# Patient Record
Sex: Male | Born: 1985 | Race: Black or African American | Hispanic: No | Marital: Single | Smoking: Never smoker
Health system: Southern US, Community
[De-identification: ages and names within clinical notes are randomized; demographics above are authoritative.]

## PROBLEM LIST (undated history)

## (undated) DIAGNOSIS — F79 Unspecified intellectual disabilities: Secondary | ICD-10-CM

## (undated) DIAGNOSIS — F32A Depression, unspecified: Secondary | ICD-10-CM

## (undated) DIAGNOSIS — I10 Essential (primary) hypertension: Secondary | ICD-10-CM

## (undated) DIAGNOSIS — F840 Autistic disorder: Secondary | ICD-10-CM

## (undated) DIAGNOSIS — F329 Major depressive disorder, single episode, unspecified: Secondary | ICD-10-CM

## (undated) DIAGNOSIS — F980 Enuresis not due to a substance or known physiological condition: Secondary | ICD-10-CM

## (undated) DIAGNOSIS — T7840XA Allergy, unspecified, initial encounter: Secondary | ICD-10-CM

## (undated) HISTORY — DX: Essential (primary) hypertension: I10

## (undated) HISTORY — DX: Allergy, unspecified, initial encounter: T78.40XA

## (undated) HISTORY — DX: Autistic disorder: F84.0

## (undated) HISTORY — DX: Major depressive disorder, single episode, unspecified: F32.9

## (undated) HISTORY — DX: Depression, unspecified: F32.A

## (undated) HISTORY — DX: Enuresis not due to a substance or known physiological condition: F98.0

## (undated) HISTORY — DX: Unspecified intellectual disabilities: F79

---

## 2004-05-30 ENCOUNTER — Encounter: Admission: RE | Admit: 2004-05-30 | Discharge: 2004-05-30 | Payer: Self-pay | Admitting: Family Medicine

## 2004-08-10 ENCOUNTER — Encounter: Admission: RE | Admit: 2004-08-10 | Discharge: 2004-08-10 | Payer: Self-pay | Admitting: Family Medicine

## 2004-09-26 ENCOUNTER — Inpatient Hospital Stay (HOSPITAL_COMMUNITY): Admission: AD | Admit: 2004-09-26 | Discharge: 2004-10-01 | Payer: Self-pay | Admitting: Family Medicine

## 2004-09-26 ENCOUNTER — Emergency Department (HOSPITAL_COMMUNITY): Admission: EM | Admit: 2004-09-26 | Discharge: 2004-09-26 | Payer: Self-pay | Admitting: Family Medicine

## 2004-09-26 ENCOUNTER — Ambulatory Visit: Payer: Self-pay | Admitting: Family Medicine

## 2004-10-12 ENCOUNTER — Ambulatory Visit: Payer: Self-pay | Admitting: Sports Medicine

## 2004-12-12 ENCOUNTER — Ambulatory Visit: Payer: Self-pay | Admitting: Family Medicine

## 2005-02-07 ENCOUNTER — Ambulatory Visit: Payer: Self-pay | Admitting: Sports Medicine

## 2005-03-07 ENCOUNTER — Ambulatory Visit: Payer: Self-pay | Admitting: Family Medicine

## 2005-07-30 ENCOUNTER — Ambulatory Visit: Payer: Self-pay | Admitting: Family Medicine

## 2006-02-11 ENCOUNTER — Ambulatory Visit: Payer: Self-pay | Admitting: Family Medicine

## 2006-05-31 IMAGING — CR DG FINGER RING 2+V*R*
1 series · 1 of 1 positions shown · non-contrast
Comparison: none

CLINICAL DATA: Swelling of the distal finger.  
 RIGHT RING FINGER (THREE VIEWS) 09/26/04 
 There is no bony abnormality.  There is diffuse soft tissue swelling. 
 IMPRESSION
 No bony abnormality.

[view not recorded]
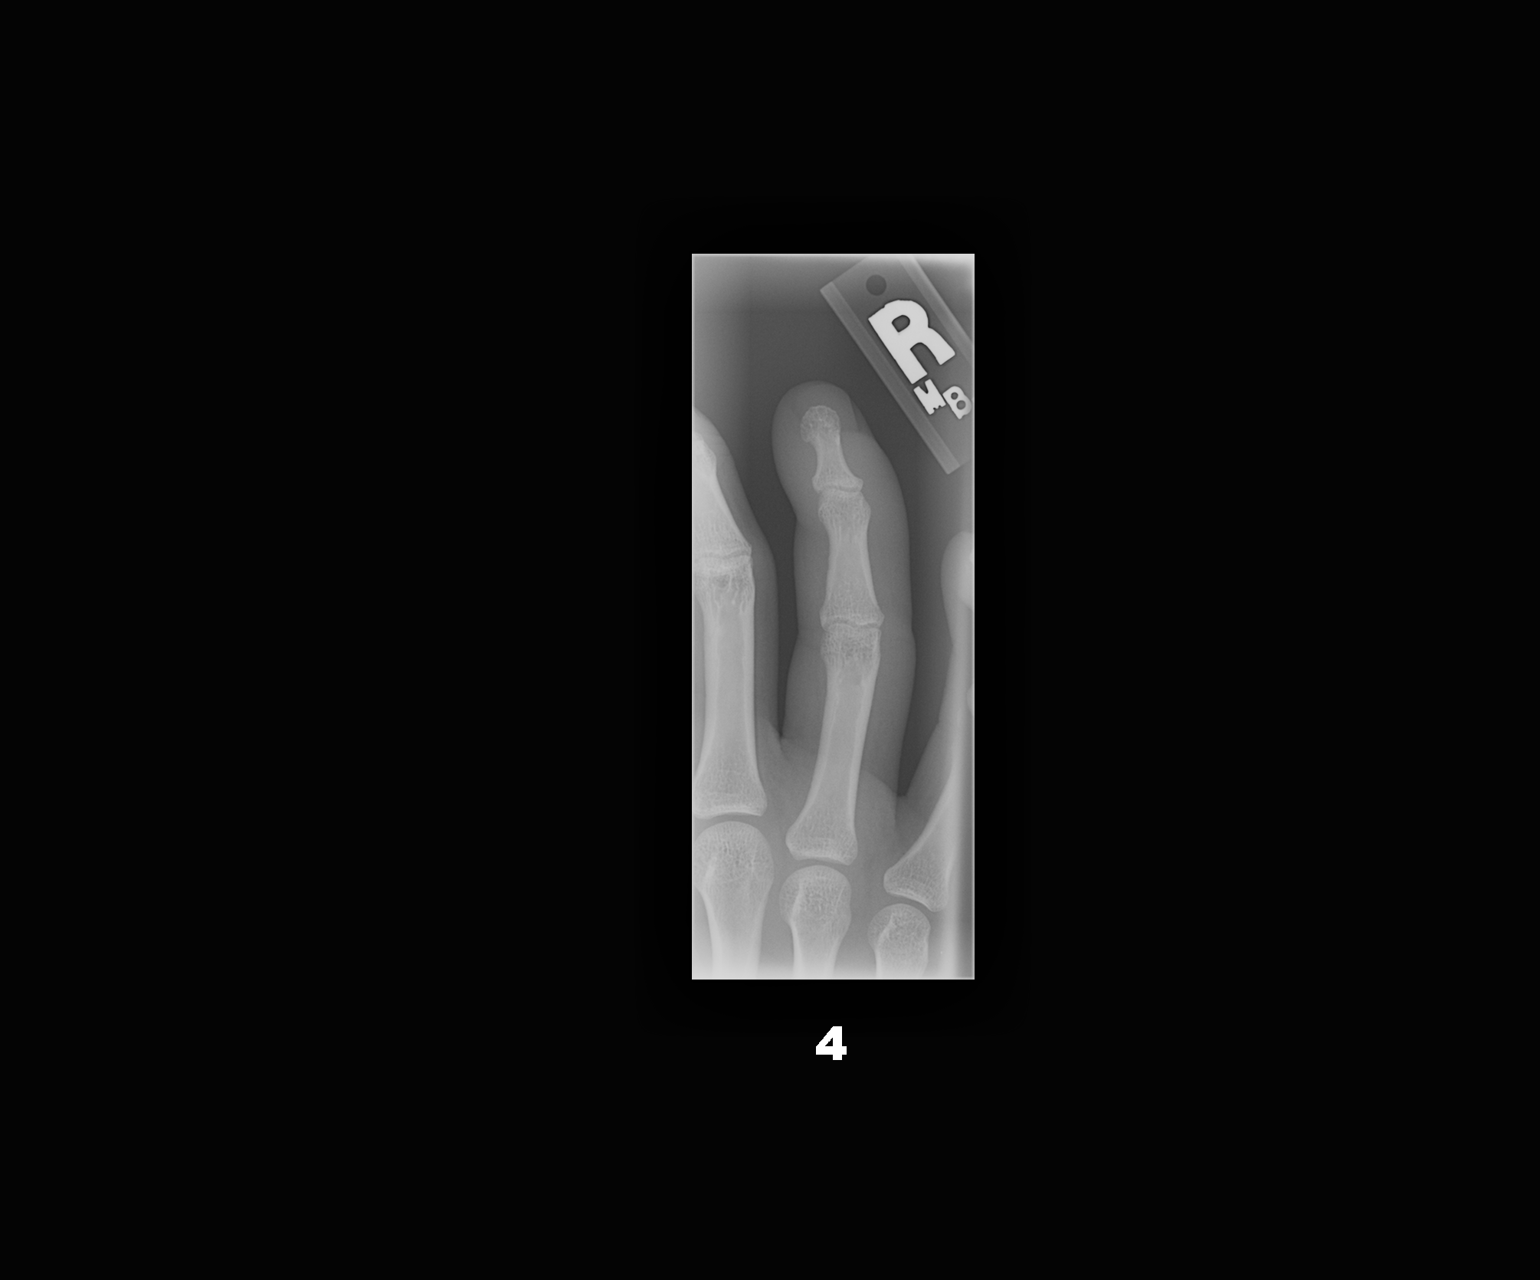

[1 of 1 positions shown; findings below may reference images not displayed]

## 2007-01-12 ENCOUNTER — Ambulatory Visit: Payer: Self-pay | Admitting: Family Medicine

## 2007-02-12 ENCOUNTER — Encounter: Payer: Self-pay | Admitting: Family Medicine

## 2007-02-12 ENCOUNTER — Ambulatory Visit: Payer: Self-pay | Admitting: Family Medicine

## 2007-02-12 LAB — CONVERTED CEMR LAB
BUN: 14 mg/dL (ref 6–23)
CO2: 29 meq/L (ref 19–32)
Calcium: 9.5 mg/dL (ref 8.4–10.5)
Chloride: 104 meq/L (ref 96–112)
Cholesterol: 136 mg/dL (ref 0–200)
Creatinine, Ser: 0.96 mg/dL (ref 0.40–1.50)
Glucose, Bld: 75 mg/dL (ref 70–99)
HDL: 43 mg/dL (ref >39)
LDL Cholesterol: 84 mg/dL (ref 0–99)
Potassium: 4.4 meq/L (ref 3.5–5.3)
Sodium: 141 meq/L (ref 135–145)
Total CHOL/HDL Ratio: 3.2
Triglycerides: 45 mg/dL (ref <150)
VLDL: 9 mg/dL (ref 0–40)

## 2007-02-19 DIAGNOSIS — I10 Essential (primary) hypertension: Secondary | ICD-10-CM

## 2007-02-19 DIAGNOSIS — F339 Major depressive disorder, recurrent, unspecified: Secondary | ICD-10-CM

## 2007-02-19 DIAGNOSIS — F70 Mild intellectual disabilities: Secondary | ICD-10-CM

## 2007-12-15 ENCOUNTER — Ambulatory Visit (HOSPITAL_COMMUNITY): Admission: RE | Admit: 2007-12-15 | Discharge: 2007-12-15 | Payer: Self-pay | Admitting: Dentistry

## 2007-12-15 ENCOUNTER — Telehealth (INDEPENDENT_AMBULATORY_CARE_PROVIDER_SITE_OTHER): Payer: Self-pay | Admitting: *Deleted

## 2008-01-28 ENCOUNTER — Telehealth: Payer: Self-pay | Admitting: *Deleted

## 2008-04-05 ENCOUNTER — Ambulatory Visit: Payer: Self-pay | Admitting: Family Medicine

## 2008-04-05 ENCOUNTER — Encounter: Payer: Self-pay | Admitting: Family Medicine

## 2008-04-05 DIAGNOSIS — F840 Autistic disorder: Secondary | ICD-10-CM | POA: Insufficient documentation

## 2008-04-05 LAB — CONVERTED CEMR LAB
ALT: 10 units/L (ref 0–53)
AST: 23 units/L (ref 0–37)
Alkaline Phosphatase: 65 units/L (ref 39–117)
Creatinine, Ser: 0.95 mg/dL (ref 0.40–1.50)
HDL: 56 mg/dL (ref >39)
Total Bilirubin: 0.8 mg/dL (ref 0.3–1.2)
Total CHOL/HDL Ratio: 2.5
VLDL: 8 mg/dL (ref 0–40)

## 2008-09-28 ENCOUNTER — Telehealth: Payer: Self-pay | Admitting: Family Medicine

## 2008-10-03 ENCOUNTER — Encounter: Payer: Self-pay | Admitting: Family Medicine

## 2008-10-24 ENCOUNTER — Telehealth (INDEPENDENT_AMBULATORY_CARE_PROVIDER_SITE_OTHER): Payer: Self-pay | Admitting: *Deleted

## 2009-01-18 ENCOUNTER — Ambulatory Visit: Payer: Self-pay | Admitting: Family Medicine

## 2009-01-18 ENCOUNTER — Telehealth: Payer: Self-pay | Admitting: *Deleted

## 2009-03-09 ENCOUNTER — Ambulatory Visit: Payer: Self-pay | Admitting: Family Medicine

## 2009-03-09 ENCOUNTER — Encounter: Payer: Self-pay | Admitting: Family Medicine

## 2009-03-09 LAB — CONVERTED CEMR LAB
Bilirubin Urine: NEGATIVE
Glucose, Urine, Semiquant: NEGATIVE
WBC Urine, dipstick: NEGATIVE
pH: 6

## 2009-04-17 ENCOUNTER — Ambulatory Visit: Payer: Self-pay | Admitting: Family Medicine

## 2009-05-04 ENCOUNTER — Ambulatory Visit: Payer: Self-pay | Admitting: Family Medicine

## 2009-05-04 ENCOUNTER — Encounter: Payer: Self-pay | Admitting: Family Medicine

## 2009-05-04 LAB — CONVERTED CEMR LAB
Alkaline Phosphatase: 65 units/L (ref 39–117)
Cholesterol: 150 mg/dL (ref 0–200)
Creatinine, Ser: 0.97 mg/dL (ref 0.40–1.50)
Glucose, Bld: 86 mg/dL (ref 70–99)
LDL Cholesterol: 83 mg/dL (ref 0–99)
Sodium: 141 meq/L (ref 135–145)
Total Bilirubin: 0.4 mg/dL (ref 0.3–1.2)
Total CHOL/HDL Ratio: 2.7
Total Protein: 7.2 g/dL (ref 6.0–8.3)
Triglycerides: 60 mg/dL (ref <150)
VLDL: 12 mg/dL (ref 0–40)

## 2009-08-09 ENCOUNTER — Ambulatory Visit: Payer: Self-pay | Admitting: Family Medicine

## 2009-08-09 ENCOUNTER — Encounter (INDEPENDENT_AMBULATORY_CARE_PROVIDER_SITE_OTHER): Payer: Self-pay | Admitting: *Deleted

## 2009-12-12 ENCOUNTER — Encounter: Payer: Self-pay | Admitting: Family Medicine

## 2009-12-12 ENCOUNTER — Ambulatory Visit: Payer: Self-pay | Admitting: Family Medicine

## 2010-03-08 ENCOUNTER — Ambulatory Visit: Payer: Self-pay | Admitting: Family Medicine

## 2010-03-08 DIAGNOSIS — F980 Enuresis not due to a substance or known physiological condition: Secondary | ICD-10-CM | POA: Insufficient documentation

## 2010-04-18 ENCOUNTER — Ambulatory Visit: Payer: Self-pay | Admitting: Family Medicine

## 2010-04-18 ENCOUNTER — Encounter: Payer: Self-pay | Admitting: Family Medicine

## 2010-04-19 ENCOUNTER — Encounter: Payer: Self-pay | Admitting: Family Medicine

## 2010-04-19 LAB — CONVERTED CEMR LAB
ALT: 11 units/L (ref 0–53)
AST: 21 units/L (ref 0–37)
Alkaline Phosphatase: 57 units/L (ref 39–117)
BUN: 11 mg/dL (ref 6–23)
Calcium: 9.7 mg/dL (ref 8.4–10.5)
Chloride: 104 meq/L (ref 96–112)
Creatinine, Ser: 1 mg/dL (ref 0.40–1.50)
HDL: 49 mg/dL (ref >39)
LDL Cholesterol: 82 mg/dL (ref 0–99)
Total Bilirubin: 0.5 mg/dL (ref 0.3–1.2)
Total CHOL/HDL Ratio: 3.1
VLDL: 19 mg/dL (ref 0–40)

## 2010-11-30 ENCOUNTER — Telehealth: Payer: Self-pay | Admitting: Family Medicine

## 2011-01-22 NOTE — Assessment & Plan Note (Signed)
Summary: having problem with incontinence,tcb   Vital Signs:  Patient profile:   25 year old male Weight:      162.1 pounds Pulse rate:   80 / minute BP sitting:   102 / 72  (right arm)  Vitals Entered By: Renato Battles slade,cma CC: request incontinence meds. Is Patient Diabetic? No Pain Assessment Patient in pain? no        Primary Care Provider:  Ardeen Garland  MD  CC:  request incontinence meds..  History of Present Illness: Comes in with his mother and worker from his adult day program.  He is nonverbal/autisitc.  WIsh to discuss incontinence.  Has been going on over a year.  1-2 times a week he wakes up and his bed is soaking wet with urine.  Other nights he is up multiple times a night to urinate.  MOm has been stopping giving him anything to drink after 4:30 in the afternoon, which helps, but he is so thirsty in the morning.  He goes to bed at 10:30.  No change in incontinence lately, but just heard from the daycare worker that there are meds that can help and then potentially she wouldn't have to limit his fluids so early in the day.  No dysuria, hematuria, polydipsia, fever, abdominal pain.  He is on HCTZ for HTN and she does give it to him at night.   Habits & Providers  Alcohol-Tobacco-Diet     Tobacco Status: never  Allergies: No Known Drug Allergies  Physical Exam  General:  Well-developed,well-nourished,in no acute distress; alert,appropriate and cooperative throughout examination vital reviewed   Impression & Recommendations:  Problem # 1:  ENURESIS (ICD-307.6) Assessment New  Requested DDAVP as this is what was recommended by her friend.  Given HTN and young age, think oxybutynin is best first choice. Will try IR formulat at night only first.  Also advised to give HCTZ in the AM.  Limit fluids within 2 hours of bedtime.  Return in 1 month to assess improvement.   Orders: [facility]- Est Level  3 (79892)  Complete Medication List: 1)  Hydrochlorothiazide 12.5 Mg  Caps (Hydrochlorothiazide) .Marland Kitchen.. 1 tab by mouth daily for blood pressure 2)  Risperdal 0.5 Mg Tabs (Risperidone) .... Per [facility] 3)  Clonidine Hcl 0.1 Mg Tabs (Clonidine hcl) .... Per [facility] 4)  Prozac 20 Mg Caps (Fluoxetine hcl) .... Per [facility] 5)  Miralax Powd (Polyethylene glycol 3350) .Marland KitchenMarland Kitchen. 17 gm in 8 oz fluid by mouth qdaily as needed constipation 6)  Patanol 0.1 % Soln (Olopatadine hcl) .Marland Kitchen.. 1 drop into both eyes twice daily. qs 7 days 7)  Doxycycline Monohydrate 100 Mg Caps (Doxycycline monohydrate) .... Take one pill daily for 14 days 8)  Oxybutynin Chloride 5 Mg Tabs (Oxybutynin chloride) .Marland Kitchen.. 1 tab by mouth qhs for nighttime urination  Patient Instructions: 1)  It was great to see you all today. 2)  WIth his hypertension, I would rather he try oxybutynin first.  Take one tablet at night to see how that helps.  Give it a few weeks.  It could give him dry mouth or make him dizzy.  If the dry mouth really bothers him or the dizziness persists after a week, please let me know. 3)  I would like to see him back in 1 month to see how he is doing.  If he is not better, we can talk about trying the ddavp.  4)  Also, give him his hydrochlorothiazide (HCTZ) in the morning  instead of in the evening. 5)  I would still limit fluids within 2 hours of bedtime.  If he goes to bed at 10:30, then I would stop giving him fluids at 8 or 8:30. Prescriptions: OXYBUTYNIN CHLORIDE 5 MG TABS (OXYBUTYNIN CHLORIDE) 1 tab by mouth qHS for nighttime urination  #30 x 2   Entered and Authorized by:   Ardeen Garland  MD   Signed by:   Ardeen Garland  MD on 03/08/2010   Method used:   Print then Give to Patient   RxID:   [id number]

## 2011-01-22 NOTE — Progress Notes (Signed)
Summary: UPDATE PROBLEM LIST   

## 2011-01-22 NOTE — Letter (Signed)
Summary: Generic Letter  [facility]  [address]   [place], [place] [address]   Phone: [phone]  Fax: [phone]    04/19/2010  Jossiah DAVE [address] [place], [place]  [address]  Dear Mr. Gangl,  I am happy to inform you that your labwork was normal.  We have faxed it to the [facility] as requested.  If you have any questions, please call our office.          Sincerely,   Ardeen Garland  MD  Appended Document: Generic Letter mailed.

## 2011-01-22 NOTE — Assessment & Plan Note (Signed)
Summary: CPE/KH   Vital Signs:  Patient profile:   25 year old male Height:      65 inches Weight:      167 pounds BMI:     27.89 Temp:     98.1 degrees F oral Pulse rate:   69 / minute BP sitting:   129 / 82  (left arm) Cuff size:   regular  Vitals Entered By: Tessie Fass CMA (April 18, 2010 9:35 AM) CC: complete physical Is Patient Diabetic? No Pain Assessment Patient in pain? no        Primary Care Provider:  Ardeen Garland  MD  CC:  complete physical.  History of Present Illness: [facility] is here with his mother and Luetta Nutting, a worker from his daycare program, for his yearly physical exam.  He is Programmer, multimedia, nonverbal, and attends an adult daycare program.  He does have depression and is followed at the [facility].  They prescribe his psychiatric medications.  They request a fasting lipid panel and CMET since he is on Risperdal.  His mother has no coplaints today.  Bartosz has been doing very well.  His BP is excellent today.    Habits & Providers  Alcohol-Tobacco-Diet     Tobacco Status: never  Current Medications (verified): 1)  Hydrochlorothiazide 12.5 Mg Caps (Hydrochlorothiazide) .Marland Kitchen.. 1 Tab By Mouth Daily For Blood Pressure 2)  Risperdal 0.5 Mg  Tabs (Risperidone) .... Per [facility] 3)  Clonidine Hcl 0.1 Mg  Tabs (Clonidine Hcl) .... Per [facility] 4)  Prozac 20 Mg  Caps (Fluoxetine Hcl) .... Per [facility] 5)  Miralax  Powd (Polyethylene Glycol 3350) .Marland KitchenMarland KitchenMarland Kitchen 17 Gm in 8 Oz Fluid By Mouth Qdaily As Needed Constipation 6)  Patanol 0.1 % Soln (Olopatadine Hcl) .Marland Kitchen.. 1 Drop Into Both Eyes Twice Daily. Qs 7 Days 7)  Doxycycline Monohydrate 100 Mg Caps (Doxycycline Monohydrate) .... Take One Pill Daily For 14 Days 8)  Oxybutynin Chloride 5 Mg Tabs (Oxybutynin Chloride) .Marland Kitchen.. 1 Tab By Mouth Qhs For Nighttime Urination  Allergies: No Known Drug Allergies  Past History:  Past Medical History: Last updated: 04/05/2008 autism- non verbal but  understands hospitalized for finger abscess 10-05 occasional nocturnal enuresis  Past Surgical History: Last updated: 02/19/2007 Incision and drainage finger felon R hand - 10/12/2004  Family History: Last updated: 04/05/2008 MGM- breast CA Mom- hypertension  Social History: Last updated: 01/18/2009 Lives w/ his mom and aunt.  Dad not involved.  Moved from [place] 2005.  Likes to play soccer.  Non-smoker, no drugs.  No girlfriends.  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  Ph# is [phone].  [place] number is [phone]  Physical Exam  General:  alert, well-developed, well-nourished, and well-hydrated.   vitals reviewed Head:  normocephalic, atraumatic, no abnormalities observed, and no abnormalities palpated.   Eyes:  pupils equal, pupils round, corneas and lenses clear, and no injection.   Ears:  bilateral TM's partially occluded by cerumen, visualized TMs pearly grey with good LR Nose:  External nasal examination shows no deformity or inflammation. Nasal mucosa are pink and moist without lesions or exudates. Mouth:  Oral mucosa and oropharynx without lesions or exudates.  Teeth in good repair. Lungs:  Normal respiratory effort, chest expands symmetrically. Lungs are clear to auscultation, no crackles or wheezes. Heart:  Normal rate and regular rhythm. S1 and S2 normal without gallop, murmur, click, rub or other extra sounds. Abdomen:  Bowel sounds positive,abdomen soft and non-tender without masses, organomegaly or hernias  noted. Msk:  normal ROM, no joint tenderness, no joint swelling, no joint warmth, no redness over joints, and no joint deformities.   Extremities:  No clubbing, cyanosis, edema, or deformity noted with normal full range of motion of all joints.   Neurologic:  strength normal in all extremities.   Skin:  Intact without suspicious lesions or rashes Cervical Nodes:  No lymphadenopathy noted   Impression & Recommendations:  Problem  # 1:  WELL ADULT EXAM (ICD-V70.0) Assessment Unchanged  Normal exam.  Doing well.  Weight has been increasing.  Discussed he is now in the overweight category.  Ideal weight would be about 10 pounds less.  Discussed watchign the food he is given - try to [facility] balanced and healthy.  Less fatty and sugary foods.  More vegetables. Does get a good amount of exercise at the daycare program.  CMET and FLP today to monitor risperdal.  Sennd to [facility], per last year's physical.   Orders: [facility] - Est  18-39 yrs ([address])  Problem # 2:  ENURESIS (ICD-307.6) Assessment: Improved Improved wtih oxybutynin.  Needs refilled.   Problem # 3:  HYPERTENSION, BENIGN SYSTEMIC (ICD-401.1) Assessment: Unchanged WEll controlled on HCTZ.  His updated medication list for this problem includes:    Hydrochlorothiazide 12.5 Mg Caps (Hydrochlorothiazide) .Marland Kitchen... 1 tab by mouth daily for blood pressure    Clonidine Hcl 0.1 Mg Tabs (Clonidine hcl) .Marland Kitchen... Per [facility]  Complete Medication List: 1)  Hydrochlorothiazide 12.5 Mg Caps (Hydrochlorothiazide) .Marland Kitchen.. 1 tab by mouth daily for blood pressure 2)  Risperdal 0.5 Mg Tabs (Risperidone) .... Per [facility] 3)  Clonidine Hcl 0.1 Mg Tabs (Clonidine hcl) .... Per [facility] 4)  Prozac 20 Mg Caps (Fluoxetine hcl) .... Per [facility] 5)  Miralax Powd (Polyethylene glycol 3350) .Marland KitchenMarland Kitchen. 17 gm in 8 oz fluid by mouth qdaily as needed constipation 6)  Patanol 0.1 % Soln (Olopatadine hcl) .Marland Kitchen.. 1 drop into both eyes twice daily. qs 7 days 7)  Oxybutynin Chloride 5 Mg Tabs (Oxybutynin chloride) .Marland Kitchen.. 1 tab by mouth qhs for nighttime urination  Other Orders: Comp Met-[facility] ([id number]) Lipid-[facility] ([id number]) Prescriptions: OXYBUTYNIN CHLORIDE 5 MG TABS (OXYBUTYNIN CHLORIDE) 1 tab by mouth qHS for nighttime urination  #30 x 11   Entered and Authorized by:   Ardeen Garland  MD   Signed by:   Ardeen Garland  MD on 04/18/2010   Method used:   Print then Give to  Patient   RxID:   [id number] HYDROCHLOROTHIAZIDE 12.5 MG CAPS (HYDROCHLOROTHIAZIDE) 1 tab by mouth daily for blood pressure  #30 Capsule x 11   Entered and Authorized by:   Ardeen Garland  MD   Signed by:   Ardeen Garland  MD on 04/18/2010   Method used:   Print then Give to Patient   RxID:   [id number]

## 2011-04-04 ENCOUNTER — Telehealth: Payer: Self-pay | Admitting: *Deleted

## 2011-04-04 DIAGNOSIS — I10 Essential (primary) hypertension: Secondary | ICD-10-CM

## 2011-04-04 MED ORDER — HYDROCHLOROTHIAZIDE 12.5 MG PO CAPS: 12.5000 mg | ORAL_CAPSULE | Freq: Every day | ORAL | Status: DC

## 2011-04-04 NOTE — Telephone Encounter (Signed)
Mother calls requesting refill on HCTZ . States pharmacy has been faxing and we have not received. Patient has appointment 05/01/2011. Will send in one month's supply to last until appointment.

## 2011-04-22 ENCOUNTER — Other Ambulatory Visit: Payer: Self-pay | Admitting: *Deleted

## 2011-04-22 DIAGNOSIS — F980 Enuresis not due to a substance or known physiological condition: Secondary | ICD-10-CM

## 2011-04-22 MED ORDER — OXYBUTYNIN CHLORIDE 5 MG PO TABS: 5.0000 mg | ORAL_TABLET | Freq: Every day | ORAL | Status: DC

## 2011-04-29 ENCOUNTER — Encounter: Payer: Self-pay | Admitting: Family Medicine

## 2011-05-01 ENCOUNTER — Encounter: Payer: Self-pay | Admitting: Family Medicine

## 2011-05-01 ENCOUNTER — Ambulatory Visit (INDEPENDENT_AMBULATORY_CARE_PROVIDER_SITE_OTHER): Payer: Medicaid Other | Admitting: Family Medicine

## 2011-05-01 VITALS — BP 116/71 | HR 73 | Temp 97.8°F | Ht 65.0 in | Wt 168.0 lb

## 2011-05-01 DIAGNOSIS — F980 Enuresis not due to a substance or known physiological condition: Secondary | ICD-10-CM

## 2011-05-01 DIAGNOSIS — I10 Essential (primary) hypertension: Secondary | ICD-10-CM

## 2011-05-01 DIAGNOSIS — K5900 Constipation, unspecified: Secondary | ICD-10-CM

## 2011-05-01 DIAGNOSIS — H1045 Other chronic allergic conjunctivitis: Secondary | ICD-10-CM

## 2011-05-01 DIAGNOSIS — H1010 Acute atopic conjunctivitis, unspecified eye: Secondary | ICD-10-CM | POA: Insufficient documentation

## 2011-05-01 DIAGNOSIS — R32 Unspecified urinary incontinence: Secondary | ICD-10-CM

## 2011-05-01 LAB — COMPREHENSIVE METABOLIC PANEL
ALT: 8 U/L (ref 0–53)
AST: 20 U/L (ref 0–37)
Albumin: 4.2 g/dL (ref 3.5–5.2)
Alkaline Phosphatase: 63 U/L (ref 39–117)
Glucose, Bld: 76 mg/dL (ref 70–99)
Potassium: 3.9 mEq/L (ref 3.5–5.3)
Sodium: 141 mEq/L (ref 135–145)
Total Bilirubin: 0.4 mg/dL (ref 0.3–1.2)
Total Protein: 7.2 g/dL (ref 6.0–8.3)

## 2011-05-01 MED ORDER — OXYBUTYNIN CHLORIDE 5 MG PO TABS: ORAL_TABLET | ORAL | Status: DC

## 2011-05-01 MED ORDER — OLOPATADINE HCL 0.1 % OP SOLN: 1.0000 [drp] | Freq: Two times a day (BID) | OPHTHALMIC | Status: DC

## 2011-05-01 MED ORDER — POLYETHYLENE GLYCOL 3350 17 G PO PACK: 17.0000 g | PACK | Freq: Every day | ORAL | Status: DC | PRN

## 2011-05-01 MED ORDER — HYDROCHLOROTHIAZIDE 12.5 MG PO CAPS: 12.5000 mg | ORAL_CAPSULE | Freq: Every day | ORAL | Status: DC

## 2011-05-01 NOTE — Patient Instructions (Signed)
It was nice to meet you today!  We are checking labs. 

## 2011-05-01 NOTE — Progress Notes (Deleted)
  Subjective:    Patient ID: Nicholas [facility]ing, male    DOB: 1986-01-09, 25 y.o.   MRN: [id number]  HPI    Review of Systems     Objective:   Physical Exam        Assessment & Plan:

## 2011-05-06 ENCOUNTER — Telehealth: Payer: Self-pay | Admitting: Family Medicine

## 2011-05-06 MED ORDER — HYDROCHLOROTHIAZIDE 12.5 MG PO CAPS: 12.5000 mg | ORAL_CAPSULE | Freq: Every day | ORAL | Status: DC

## 2011-05-06 NOTE — Telephone Encounter (Signed)
Mom calling re: bp med HCTZ, says it was called into pharmacy but MD did not add refills, is asking for MD to add 10 refills.

## 2011-05-06 NOTE — Telephone Encounter (Signed)
Will fwd. To PCP. .[facility]  

## 2011-05-06 NOTE — Telephone Encounter (Signed)
Done

## 2011-05-07 ENCOUNTER — Encounter: Payer: Self-pay | Admitting: Family Medicine

## 2011-05-07 NOTE — Assessment & Plan Note (Signed)
Increase Ditropan q hs.

## 2011-05-07 NOTE — Assessment & Plan Note (Signed)
Controlled. Refill medications. Check BMP.

## 2011-05-07 NOTE — Progress Notes (Signed)
  Subjective:    Patient ID: [facility] Harding, male    DOB: Aug 26, 1986, 25 y.o.   MRN: [id number]  HPI  1. Enuresis: Rx Ditropan. Went from wetting the bed 4-5 nights per week to 1-2 nights per week. No side effects noted.  2. HTN: Needs refills.  3. MR/Autism: Followed by Mental Health.  Review of Systems SEE HPI    Objective:   Physical Exam  Vitals reviewed. Constitutional: He appears well-developed and well-nourished. No distress.  HENT:  Left Ear: External ear normal.  Nose: Nose normal.  Mouth/Throat: Oropharynx is clear and moist.  Eyes: Conjunctivae and EOM are normal. Pupils are equal, round, and reactive to light.  Neck: Neck supple. No thyromegaly present.  Cardiovascular: Normal rate, regular rhythm, normal heart sounds and intact distal pulses.   Pulmonary/Chest: Effort normal and breath sounds normal.  Abdominal: Soft. Bowel sounds are normal. He exhibits no distension and no mass. There is no tenderness.  Musculoskeletal: Normal range of motion.  Neurological: He has normal reflexes.  Skin: Skin is warm and dry.      Assessment & Plan:

## 2011-05-10 NOTE — Op Note (Signed)
NAME:  N[facility], [facility] Harding NO.:  [id number]   MEDICAL RECORD NO.:  [id number]          PATIENT TYPE:  INP   LOCATION:  5021                         FACILITY:  [facility]   PHYSICIAN:  Dionne Ano. Gramig III, M.D.DATE OF BIRTH:  09-20-1986   DATE OF PROCEDURE:  09/27/2004  DATE OF DISCHARGE:                                 OPERATIVE REPORT   PREOPERATIVE DIAGNOSIS:  Right ring finger deep abscess with associated  lymphangitis and poor motor function secondary to the above.   POSTOPERATIVE DIAGNOSIS:  Right ring finger deep abscess with associated  lymphangitis and poor motor function secondary to the above.   PROCEDURE PERFORMED:  1.  Incision and drainage deep abscess right ring finger.  2.  Partial nail plate removal, right ring finger.   SURGEON:  Dionne Ano. Amanda Pea, M.D.   ASSISTANT:  Karie Chimera, P.A.-C.   COMPLICATIONS:  None.   ANESTHESIA:  General LMA.   TOURNIQUET TIME:  Less than 30 minutes.   ESTIMATED BLOOD LOSS:  Minimal.   DRAINS:  Large Iodoform drain was packed in the wound.   INDICATIONS FOR PROCEDURE:  The patient is a 25 year old autistic male who  has a long history of finger biting habitus.  This is an uncontrollable urge  and has fulminated in a large, deep abscess about the right ring finger.  We  have attempted to examine him and place an IV line, however, the autism  makes it quite difficult.  I have discussed with the parent, his mother, the  risks and benefits of surgery and, at the present time, feel that it is  absolutely essential to drain the large abscess to preserve the life of the  finger.  They understand this, the risks and benefits, and desire to  proceed.  All questions have been encouraged and answered preoperatively.   OPERATIVE FINDINGS:  The patient has a large, deep abscess about the right  ring finger.  He underwent I&D without difficulty as described above.  Partial nail plate removal was also accomplished.   PROCEDURE IN DETAIL:  The patient was taken to the operating suite.  He was  very carefully given a mask anesthetic until he was under a sedate state at  which time an LMA anesthetic was introduced and an IV line was placed.  Once  this was done, the patient then underwent prep and drape with Betadine scrub  and paint followed by elevation of the arm.  A tourniquet was then placed  but was not inflated.  Following this, I then performed an I&D of the deep  abscess.  A curvilinear dorsal approach to the finger was accomplished.  This encompassed the area distally and proximally around the DIP region.  A  large amount of nonviable skin was removed and a large abscess was drained.  Aerobic and anaerobic cultures were taken.  I performed a partial nail plate  removal.  The patient had deep tracking of the abscess proximally.  This was  not a standard paronychia but was rather a deep abscess.  There was no  obvious gross bony osteomyelitis.  The patient was copiously irrigated and  following this, was packed with Iodoform gauze and placed in a splint  apparatus.  He woke up successfully without problems and was transferred to  the recovery room.   We will plan for elevation, IV antibiotics, and initiation of whirlpools and  soaks tomorrow with wicking of the wound.  We will keep the IV in as long as  the patient allows while he is in house, however, the autism certainly makes  it difficult and we will gear our treatment based upon his comfort level and  infection improvement.  It has been a pleasure to participate in his care.  All questions have been encouraged and answered.       WMG/MEDQ  D:  09/27/2004  T:  09/27/2004  Job:  [id number]

## 2011-05-10 NOTE — H&P (Signed)
NAME:  Nicholas Harding, Nicholas Harding NO.:  [id number]   MEDICAL RECORD NO.:  [id number]          PATIENT TYPE:  INP   LOCATION:  5021                         FACILITY:  [facility]   PHYSICIAN:  Santiago Bumpers. Hensel, M.D.DATE OF BIRTH:  03-04-1986   DATE OF ADMISSION:  09/26/2004  DATE OF DISCHARGE:                                HISTORY & PHYSICAL   PRIMARY CARE PHYSICIAN:  Seymour Bars, D.O., [facility]  resident.   REFERRING PHYSICIAN:  Seymour Bars, D.O.   CONSULTATIONS:  1.  Dionne Ano. Amanda Pea, M.D.  2.  Janetta Hora. Gelene Mink, M.D.   CHIEF COMPLAINT:  Swollen right finger.   HISTORY OF PRESENT ILLNESS:  Patient is a 25 year old [facility]ing with a  one-day history of painful swelling of Nicholas right fourth digit.  Patient was  at school and [facility] nurse noticed that his finger was infected.  [facility] patient  [facility].  While at [facility] patient  had an x-ray obtained and several doctors attempted to drain Nicholas nail;  however, were unsuccessful due to Nicholas patient's being agitated and  combative.  [facility] patient is to be directly admitted to [facility] for conscious sedation and drainage by hand surgeon Dr. Amanda Pea.   [facility]icholas patient has a history of increased biting of his nails as noted by his  mother.  Patient is also nontalkative, but understands.   REVIEW OF SYSTEMS:  CONSTITUTIONAL: There are no fevers, nausea, vomiting,  changes in appetite, weight loss, or weight gain. CARDIOVASCULAR:  No chest  pain.  RESPIRATORY: No shortness of breath.  GASTROINTESTINAL: No diarrhea  or constipation.  SKIN:  Nicholas patient had hyperkeratotic areas on Nicholas  extensor sites of his bilateral wrists.  NEUROLOGIC:  Nontalkative.  PSYCHIATRIC:  No new psychiatric complaints.  No depression noted while on  medications.  MUSCULOSKELETAL:  Noncontributory.  ENDOCRINOLOGIC:  Noncontributory.  __________:  Noncontributory.  HEMATOLOGIC:  Noncontributory.   ALLERGIES:  There are no known drug allergies.   MEDICATIONS:  1.  Clonidine 0.1 mg 2 tablets q.h.s.  2.  Hydrochlorothiazide 12.5 mg p.o. daily.  3.  MiraLax 15 grams half a capful daily p.r.n.  4.  Prozac 10 mg p.o. q.a.m.  5.  Risperdal 0.5 mg p.o. q.h.s.   PAST MEDICAL HISTORY:  1.  Autism, nonverbal, but understands.  2.  Occasional nocturna enuresis.  3.  Mental retardation, mild.  4.  Depression, major, recurrent.  5.  Hypertension, benign, ___________.  6.  Constipation.   FAMILY HISTORY:  Mother has a history of hypertension.  Maternal grandmother  has a history of breast cancer.  Father's medical history is unable to be  obtained due to no contact.   SOCIAL HISTORY:  [facility] patient is a 25 year old.  He attends [facility].  He lives with his mother and his aunt.  Dad is not involved.  Patient moved from [place] in 2005.  [facility]olas patient likes to play soccer,  nonsmoker.  No drugs.  No girl friends.   ADVANCED DIRECTIVE:  This  patient is a [employer].   OTHER PHYSICIANS:  1.  Primary M.D. is Seymour Bars, D.O., [facility].  2.  Mental health - patient is seen at [facility], Dr. Mikey Harding, phone number [phone].   PHYSICAL EXAMINATION:  GENERAL APPEARANCE:  Nicholas patient is a well-developed,  well-nourished African-American male with autism, nontalkative.  VITAL SIGNS:  Vitals are yet to be obtained on H&P.  HEENT:  Extraocular muscles are intact.  Oropharynx is benign.  Moist mucous  membranes.  NECK:  Nicholas neck is supple with no masses, thyroid enlargement or tenderness.  CARDIOVASCULAR:  Regular rate and rhythm.  No murmurs, rubs or gallops.  RESPIRATORY:  Clear to auscultation bilaterally. Good symmetrical movement.  ABDOMEN:  Nicholas abdomen is soft, nontender, nondistended with positive  normoactive bowel sounds.  EXTREMITIES:  There is no noted cyanosis, edema, or clubbing of Nicholas lower  extremities,  or Nicholas left hand,  it is noted on Nicholas right fourth finger there  is redness and edema extending all Nicholas way to Nicholas metacarpal joint with  tenderness to palpation.  NEUROLOGIC:  Cranial nerves nonfocal.  SKIN:  There are hyperkeratotic areas noted on Nicholas bilateral extensor  wrists.   LABORATORY DATA:  Labs and tests:  Labs were unable to be obtained secondary  to Nicholas patient's combative nature and we were unable to obtain IV access.  Nicholas blood work and labs will be drawn while patient is under sedation in [facility]  OR.   ASSESSMENT AND PLAN:  This is a 25 year old Nicholas male with right fourth  digit abscess.   Problem #1  Abscess:  Nicholas patient is to go to hand surgery tomorrow for  sedation and drainage of Nicholas right fourth digit.  Patient will be placed on  Keflex 500 mg 1 tablet p.o. four times daily.  Nicholas patient is to have warm  water soaks if tolerated.   Problem #2  Gastrointestinal: Nicholas patient will be placed NPO prior to going  to [facility].   Problem #3  Autism:  Patient needs family caregiver present 24 hours a day.  Patient sees Dr. Mikey Harding.  We will be continuing his Risperdal and  Prozac while he is in [facility].   Problem 4  Hypertension:  We will continue his hydrochlorothiazide while in  [facility].   Problem #5  Access of Laboratory Work:  See noted in laboratories.       SJ/MEDQ  D:  09/26/2004  T:  09/27/2004  Job:  [id number]   cc:   Seymour Bars, D.O.  [facility].  Family Prac. Resident  [place], [place] [address]  Fax: [phone]   Eddie Candle, M.D.

## 2011-05-10 NOTE — Discharge Summary (Signed)
NAME:  Nicholas Harding, Nicholas Harding NO.:  [id number]   MEDICAL RECORD NO.:  [id number]          PATIENT TYPE:  INP   LOCATION:  5021                         FACILITY:  [facility]   PHYSICIAN:  Santiago Bumpers. Hensel, M.D.DATE OF BIRTH:  11/23/1986   DATE OF ADMISSION:  09/26/2004  DATE OF DISCHARGE:  10/01/2004                                 DISCHARGE SUMMARY   DIAGNOSES:  1.  Right __________ abscess.  2.  Autism.  3.  Hypertension.   CONSULTATIONS:  Dionne Ano. Amanda Pea, M.D.   PROCEDURE:  I&D under anesthesia done on September 26, 2004.   BRIEF HISTORY:  This is a 25 year old African American male with autism and  high blood pressure with __________ .  This abscess needed to be I&D under  anesthesia due to his condition and agitation.   MEDICATIONS IN THE HOSPITAL:  1.  Status post I&D Unasyn 3 grams IV q.6h. for five days.  2.  Prozac 10 mg p.o. daily.  3.  Risperidone __________ p.o. q.h.s.  4.  Morphine sulfate 4 mg IV q.4h. p.r.n.  5.  Percocet 1-2 tablets p.o. q.4-6h. p.r.n.  6.  Clonidine 0.2 mg p.o. q.h.s.   HOSPITAL COURSE:  Problem 1.  __________ required whirlpool therapy for  several days and wet to dry wound care.  The patient had good healing  process with the therapy and antibiotics.  Mom was interested on discharge  how to do the dressing changes, no Home Health recommended at that point.  The patient was switched from IV antibiotics to Doxycycline 100 mg by mouth  twice a day per Dr. Amanda Pea.   Problem 2.  Autism.  Patient was stable in his condition.  Continue to take  medications of Risperidone __________ 2.5 mg p.o. q.h.s. and Prozac 10 mg  p.o. daily for __________ .   Problem 3.  Hypertension.  The patient was stable during hospitalization,  treatment with Clonidine 0.2 mg p.o. q.h.s.   Problem 4.  Pain management.  For pain the patient received Percocet and  morphine sulfate p.r.n. while in the hospital and was given ibuprofen 600 mg  to take one tab  every 6 hours for pain if necessary.   Follow-up appointment with Dr. __________ at [facility] on  10/21 at __________.  Also follow-up appointment with Dr. Amanda Pea of  orthopedics __________ 11/28/04 at 1:30 p.m.       IM/MEDQ  D:  10/04/2004  T:  10/04/2004  Job:  [id number]

## 2011-05-30 ENCOUNTER — Ambulatory Visit (INDEPENDENT_AMBULATORY_CARE_PROVIDER_SITE_OTHER): Payer: Medicaid Other | Admitting: Family Medicine

## 2011-05-30 ENCOUNTER — Encounter: Payer: Self-pay | Admitting: Family Medicine

## 2011-05-30 VITALS — BP 117/79 | HR 98 | Temp 98.9°F | Wt 171.0 lb

## 2011-05-30 DIAGNOSIS — H1045 Other chronic allergic conjunctivitis: Secondary | ICD-10-CM

## 2011-05-30 DIAGNOSIS — H1010 Acute atopic conjunctivitis, unspecified eye: Secondary | ICD-10-CM

## 2011-05-30 MED ORDER — OLOPATADINE HCL 0.1 % OP SOLN: 1.0000 [drp] | Freq: Two times a day (BID) | OPHTHALMIC | Status: DC

## 2011-05-30 NOTE — Progress Notes (Signed)
  Subjective:    Patient ID: Nicholas Harding, male    DOB: [birth year], 25 y.o.   MRN: [id number]  HPI  1. Conjunctivitis: 3 days ago, mom gave Patanol drop in affected eye (right), got better, mom wants to make sure that everything is okay and that it is okay to continue giving drops. No fever/chills, N/V/D, rash, trauma, irritability, or rubbing eyes.  Review of Systems SEE HPI.    Objective:   Physical Exam  Vitals reviewed. Constitutional: He appears well-developed and well-nourished. No distress.  HENT:  Right Ear: External ear normal.  Left Ear: External ear normal.  Nose: Nose normal.  Mouth/Throat: Oropharynx is clear and moist.  Eyes: Conjunctivae and EOM are normal. Pupils are equal, round, and reactive to light. Right eye exhibits no discharge. Left eye exhibits no discharge.      Assessment & Plan:

## 2011-05-30 NOTE — Assessment & Plan Note (Signed)
Normal exam today. Discussed diagnosis with parent and appropriate use of eye drops. Refill today. Precautions for other types of conjunctivitis and possible corneal abrasion discussed.

## 2011-06-12 ENCOUNTER — Other Ambulatory Visit: Payer: Self-pay | Admitting: Family Medicine

## 2011-06-12 MED ORDER — LORAZEPAM 0.5 MG PO TABS: 0.5000 mg | ORAL_TABLET | Freq: Three times a day (TID) | ORAL | Status: AC | PRN

## 2011-06-12 MED ORDER — BENZTROPINE MESYLATE 1 MG PO TABS
0.5000 mg | ORAL_TABLET | Freq: Two times a day (BID) | ORAL | Status: DC
Start: 2011-06-12 — End: 2015-06-14

## 2011-09-09 ENCOUNTER — Other Ambulatory Visit: Payer: Self-pay | Admitting: Family Medicine

## 2011-09-09 DIAGNOSIS — F980 Enuresis not due to a substance or known physiological condition: Secondary | ICD-10-CM

## 2011-09-09 NOTE — Telephone Encounter (Signed)
Will fwd. To Dr.Corey for review. Nicholas Harding  

## 2011-09-09 NOTE — Telephone Encounter (Signed)
Ms. Sheard needs to know if she needs to bring Nicholas Harding in for a refill of Oxybutnynin 5mg .  He was last seen 3 months ago.  His next refill will be needed by 10/1 and they use [facility].  Please give her a call.

## 2011-09-11 MED ORDER — OXYBUTYNIN CHLORIDE 5 MG PO TABS: ORAL_TABLET | ORAL | Status: DC

## 2011-09-11 NOTE — Telephone Encounter (Signed)
Refilled x1.  Will need a visit in my clinic. Left a message

## 2011-09-20 ENCOUNTER — Encounter: Payer: Self-pay | Admitting: Family Medicine

## 2011-09-20 ENCOUNTER — Ambulatory Visit (INDEPENDENT_AMBULATORY_CARE_PROVIDER_SITE_OTHER): Payer: Medicaid Other | Admitting: Family Medicine

## 2011-09-20 VITALS — BP 112/67 | HR 86 | Wt 169.0 lb

## 2011-09-20 DIAGNOSIS — R32 Unspecified urinary incontinence: Secondary | ICD-10-CM

## 2011-09-20 DIAGNOSIS — Z23 Encounter for immunization: Secondary | ICD-10-CM

## 2011-09-20 DIAGNOSIS — F980 Enuresis not due to a substance or known physiological condition: Secondary | ICD-10-CM

## 2011-09-20 MED ORDER — OXYBUTYNIN CHLORIDE 5 MG PO TABS: ORAL_TABLET | ORAL | Status: DC

## 2011-09-20 NOTE — Patient Instructions (Signed)
Come back in 7 months for a wellness visit.  (Physical) in May

## 2011-09-20 NOTE — Progress Notes (Signed)
Nicholas Harding presents to clinic today for refill of oxybutynin.  He has mental retardation and autism.  He takes oxybutynin for enuresis.  He does well this medication without side effect.  He is in his normal state of health.  Additionally his mother would like Nicholas Harding flu shot.  PMH reviewed.  ROS as above otherwise neg Medications reviewed.  Exam:  BP 112/67  Pulse 86  Wt 169 lb (76.658 kg) Gen: Well NAD HEENT: EOMI,  MMM Lungs: CTABL Nl WOB Heart: RRR no MRG Abd: NABS, NT, ND Exts: Non edematous BL  LE, warm and well perfused.

## 2011-09-20 NOTE — Assessment & Plan Note (Signed)
Doing well oxybutynin refilled for 12 months. We'll followup at well adult visit in May

## 2011-09-27 LAB — CBC
Hemoglobin: 14
MCHC: 33.9
MCV: 90.3
RBC: 4.57
WBC: 4.8

## 2011-09-27 LAB — BASIC METABOLIC PANEL
BUN: 13
CO2: 23
Chloride: 107
Creatinine, Ser: 0.82
Glucose, Bld: 99

## 2012-01-29 ENCOUNTER — Ambulatory Visit (INDEPENDENT_AMBULATORY_CARE_PROVIDER_SITE_OTHER): Payer: Medicaid Other | Admitting: Family Medicine

## 2012-01-29 ENCOUNTER — Encounter: Payer: Self-pay | Admitting: Family Medicine

## 2012-01-29 VITALS — BP 157/76 | HR 66 | Temp 99.2°F | Ht 65.0 in | Wt 162.0 lb

## 2012-01-29 DIAGNOSIS — H1089 Other conjunctivitis: Secondary | ICD-10-CM | POA: Insufficient documentation

## 2012-01-29 DIAGNOSIS — R234 Changes in skin texture: Secondary | ICD-10-CM | POA: Insufficient documentation

## 2012-01-29 DIAGNOSIS — H109 Unspecified conjunctivitis: Secondary | ICD-10-CM

## 2012-01-29 MED ORDER — POLYMYXIN B-TRIMETHOPRIM 10000-0.1 UNIT/ML-% OP SOLN: OPHTHALMIC | Status: DC

## 2012-01-29 MED ORDER — UREA 40 % EX CREA: 1.0000 "application " | TOPICAL_CREAM | Freq: Every day | CUTANEOUS | Status: DC

## 2012-01-29 NOTE — Patient Instructions (Addendum)
Thank you for coming in today. Use the polytrim eye drops in both eyes every 4 hours while awake for 7 days.  Let know if he gets worse or not getting a few days.  Let know if he acts like he is in pain or having trouble breathing.  Apply the urea cream daily. If the skin feels normal/ smooth then stop for a while.

## 2012-01-29 NOTE — Progress Notes (Signed)
Nicholas Harding is a 26 y.o. male who presents to [facility] today for  [place] presents to clinic today with mild fever bilateral conjunctival injection and discharge for the past 3 days. He is eating and drinking normally and acting like his normal self. He does have a mild cough. He has been sent home from adult daycare.  Additionally his mom notes that he has thickened skin on the dorsum of his wrist due to repetitive motions. As previously is being treated with urea cream by orthopedics however she would like me to take over care.     PMH reviewed. 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 Current Outpatient Prescriptions  Medication Sig Dispense Refill  . benztropine (COGENTIN) 1 MG tablet Take 0.5 tablets (0.5 mg total) by mouth 2 (two) times daily.  30 tablet  0  . cloNIDine (CATAPRES) 0.1 MG tablet Per [facility]      . FLUoxetine (PROZAC) 20 MG capsule Per [facility]      . hydrochlorothiazide (,MICROZIDE/HYDRODIURIL,) 12.5 MG capsule Take 1 capsule (12.5 mg total) by mouth daily.  30 capsule  10  . LORazepam (ATIVAN) 0.5 MG tablet Take 1 tablet (0.5 mg total) by mouth every 8 (eight) hours as needed for anxiety.  30 tablet  0  . olopatadine (PATANOL) 0.1 % ophthalmic solution Place 1 drop into both eyes 2 (two) times daily. Use for 7 days.  5 mL  2  . oxybutynin (DITROPAN) 5 MG tablet Take 2 tabs po before bed.  60 tablet  12  . polyethylene glycol (MIRALAX / GLYCOLAX) packet Take 17 g by mouth daily as needed. Dissolve in 8 ounce of fluid-for constipation  14 each  3  . risperiDONE (RISPERDAL) 0.5 MG tablet Per [facility]      . trimethoprim-polymyxin b (POLYTRIM) ophthalmic solution 1 drop both eyes every 4 hours while awake for 7 days.  10 mL  0  . urea (CARMOL) 40 % CREA Apply 1 application topically daily.  1 each  6    Exam:  BP 157/76  Pulse 66  Temp(Src) 99.2 F (37.3 C) (Oral)  Ht 5\' 5"   (1.651 m)  Wt 162 lb (73.483 kg)  BMI 26.96 kg/m2 Gen: Well NAD HEENT:MMM bilateral conjunctival injection with mild discharge. Normal pupillary spots and extraocular motion. not in pain appearing Lungs: CTABL Nl WOB Heart: RRR no MRG Abd: NABS, NT, ND Exts: Non edematous BL  LE, warm and well perfused.  Skin: Hypopigmented and thickened skin on the dorsum of rest. Soft to touch

## 2012-01-29 NOTE — Assessment & Plan Note (Signed)
Likely infectious conjunctivitis. Plan to use Polytrim eyedrops both eyes every 4 hours while awake for 7 days. Discussed warning signs with mom who expresses understanding. Followup in a few months

## 2012-01-29 NOTE — Assessment & Plan Note (Addendum)
Thickened skin on the dorsum of wrists do to repetitive motions. Being treated with urea cream.  Advised mom to use the cream when his then becomes thickened and scaly but stop use when the skin feels soft.  She expresses understand

## 2012-02-06 ENCOUNTER — Telehealth: Payer: Self-pay | Admitting: *Deleted

## 2012-02-06 NOTE — Telephone Encounter (Signed)
PA required for Urea Cream.  Preferred meds are Retina A Micro , Differin , Clindamycin phosphate , benzaclin , or  Benzole peroxide .  Will place in MD box.

## 2012-02-07 NOTE — Telephone Encounter (Signed)
PA form faxed to Medicaid 

## 2012-02-10 NOTE — Telephone Encounter (Signed)
Received note back form medicaid today stating this cream does not required PA .  I called Medicaid and was told Urea cream is not covered by medicaid. at all.

## 2012-02-10 NOTE — Telephone Encounter (Signed)
Called mom back, Medicaid will not pay for urea cream. I advised to buy it over the counter.  She will discover the cost and get back to me if she has problems.

## 2012-05-06 ENCOUNTER — Encounter: Payer: Medicaid Other | Admitting: Family Medicine

## 2012-05-20 ENCOUNTER — Ambulatory Visit (INDEPENDENT_AMBULATORY_CARE_PROVIDER_SITE_OTHER): Payer: Medicaid Other | Admitting: Family Medicine

## 2012-05-20 ENCOUNTER — Encounter: Payer: Self-pay | Admitting: Family Medicine

## 2012-05-20 VITALS — BP 112/70 | HR 92 | Ht 65.0 in | Wt 167.0 lb

## 2012-05-20 DIAGNOSIS — K5900 Constipation, unspecified: Secondary | ICD-10-CM

## 2012-05-20 DIAGNOSIS — F980 Enuresis not due to a substance or known physiological condition: Secondary | ICD-10-CM

## 2012-05-20 DIAGNOSIS — Z0000 Encounter for general adult medical examination without abnormal findings: Secondary | ICD-10-CM

## 2012-05-20 DIAGNOSIS — R32 Unspecified urinary incontinence: Secondary | ICD-10-CM

## 2012-05-20 DIAGNOSIS — F70 Mild intellectual disabilities: Secondary | ICD-10-CM

## 2012-05-20 DIAGNOSIS — I10 Essential (primary) hypertension: Secondary | ICD-10-CM

## 2012-05-20 MED ORDER — POLYETHYLENE GLYCOL 3350 17 G PO PACK: 17.0000 g | PACK | Freq: Every day | ORAL | Status: DC | PRN

## 2012-05-20 MED ORDER — OXYBUTYNIN CHLORIDE 5 MG PO TABS: ORAL_TABLET | ORAL | Status: DC

## 2012-05-20 MED ORDER — HYDROCHLOROTHIAZIDE 12.5 MG PO CAPS: 12.5000 mg | ORAL_CAPSULE | Freq: Every day | ORAL | Status: DC

## 2012-05-20 NOTE — Patient Instructions (Signed)
Thank you for coming in today. Nicholas Harding looks good today.  We will do some labs today.  Follow up as needed or in 1 year.  I will fax the labs to [facility] [phone]

## 2012-05-20 NOTE — Assessment & Plan Note (Signed)
Doing well currently. Managed with adult day Center and the parents.  Psychiatry helping as well. We'll continue to follow

## 2012-05-20 NOTE — Assessment & Plan Note (Signed)
Refill oxybutynin. Doing well

## 2012-05-20 NOTE — Progress Notes (Signed)
Nicholas Harding is a 26 y.o. male who presents to [facility] today for a physical checkup.  In the interim he has done well. His medications remain stable. He has no new acute complaints today. He is eating and drinking well and continuing his normal level of activity. He attends an adult day Center.  The speech therapist there are looking to use an assistive device to augment his speech ability.   Additionally his psychiatrist would like some labs today.  Doing well no fevers chills nausea vomiting or diarrhea.   PMH: Reviewed significant for mental retardation History  Substance Use Topics  . Smoking status: Never Smoker   . Smokeless tobacco: Never Used  . Alcohol Use: No   ROS as above  Medications reviewed. Current Outpatient Prescriptions  Medication Sig Dispense Refill  . benztropine (COGENTIN) 1 MG tablet Take 0.5 tablets (0.5 mg total) by mouth 2 (two) times daily.  30 tablet  0  . cloNIDine (CATAPRES) 0.1 MG tablet Per [facility]      . FLUoxetine (PROZAC) 20 MG capsule Per [facility]      . hydrochlorothiazide (MICROZIDE) 12.5 MG capsule Take 1 capsule (12.5 mg total) by mouth daily.  30 capsule  10  . LORazepam (ATIVAN) 0.5 MG tablet Take 1 tablet (0.5 mg total) by mouth every 8 (eight) hours as needed for anxiety.  30 tablet  0  . olopatadine (PATANOL) 0.1 % ophthalmic solution Place 1 drop into both eyes 2 (two) times daily. Use for 7 days.  5 mL  2  . oxybutynin (DITROPAN) 5 MG tablet Take 2 tabs po before bed.  60 tablet  12  . polyethylene glycol (MIRALAX / GLYCOLAX) packet Take 17 g by mouth daily as needed. Dissolve in 8 ounce of fluid-for constipation  14 each  3  . risperiDONE (RISPERDAL) 0.5 MG tablet Per [facility]      . urea (CARMOL) 40 % CREA Apply 1 application topically daily.  1 each  6  . DISCONTD: hydrochlorothiazide (,MICROZIDE/HYDRODIURIL,) 12.5 MG capsule Take 1 capsule (12.5 mg total) by mouth daily.  30 capsule  10  . DISCONTD: oxybutynin (DITROPAN)  5 MG tablet Take 2 tabs po before bed.  60 tablet  12  . DISCONTD: polyethylene glycol (MIRALAX / GLYCOLAX) packet Take 17 g by mouth daily as needed. Dissolve in 8 ounce of fluid-for constipation  14 each  3    Exam:  BP 112/70  Pulse 92  Ht 5\' 5"  (1.651 m)  Wt 167 lb (75.751 kg)  BMI 27.79 kg/m2 Gen: Well NAD, active male in the exam room today. HEENT: EOMI,  MMM Lungs: CTABL Nl WOB Heart: RRR no MRG Abd: NABS, NT, ND Exts: Non edematous BL  LE, warm and well perfused.   No results found for this or any previous visit (from the past 72 hour(s)).

## 2012-05-20 NOTE — Assessment & Plan Note (Signed)
Blood pressure well controlled. Check basic metabolic panel today and refill hydrochlorothiazide

## 2012-05-21 LAB — COMPLETE METABOLIC PANEL WITH GFR
Albumin: 4.2 g/dL (ref 3.5–5.2)
Alkaline Phosphatase: 63 U/L (ref 39–117)
BUN: 10 mg/dL (ref 6–23)
CO2: 24 mEq/L (ref 19–32)
GFR, Est African American: >89 mL/min
GFR, Est Non African American: >89 mL/min
Glucose, Bld: 109 mg/dL — ABNORMAL HIGH (ref 70–99)
Potassium: 4.2 mEq/L (ref 3.5–5.3)
Total Protein: 7.1 g/dL (ref 6.0–8.3)

## 2012-05-21 LAB — CBC
HCT: 42.6 % (ref 39.0–52.0)
MCHC: 33.3 g/dL (ref 30.0–36.0)
Platelets: 227 10*3/uL (ref 150–400)
RDW: 13.9 % (ref 11.5–15.5)
WBC: 5.2 10*3/uL (ref 4.0–10.5)

## 2012-05-22 ENCOUNTER — Encounter: Payer: Self-pay | Admitting: Family Medicine

## 2012-06-17 ENCOUNTER — Encounter: Payer: Self-pay | Admitting: Family Medicine

## 2012-06-17 ENCOUNTER — Ambulatory Visit (INDEPENDENT_AMBULATORY_CARE_PROVIDER_SITE_OTHER): Payer: Medicaid Other | Admitting: Family Medicine

## 2012-06-17 VITALS — BP 101/67 | HR 76 | Temp 97.8°F | Wt 163.0 lb

## 2012-06-17 DIAGNOSIS — H1045 Other chronic allergic conjunctivitis: Secondary | ICD-10-CM

## 2012-06-17 DIAGNOSIS — H109 Unspecified conjunctivitis: Secondary | ICD-10-CM

## 2012-06-17 DIAGNOSIS — H1010 Acute atopic conjunctivitis, unspecified eye: Secondary | ICD-10-CM

## 2012-06-17 MED ORDER — POLYMYXIN B-TRIMETHOPRIM 10000-0.1 UNIT/ML-% OP SOLN: OPHTHALMIC | Status: DC

## 2012-06-17 NOTE — Patient Instructions (Signed)
Conjunctivitis Conjunctivitis is commonly called "pink eye." Conjunctivitis can be caused by bacterial or viral infection, allergies, or injuries. There is usually redness of the lining of the eye, itching, discomfort, and sometimes discharge. There may be deposits of matter along the eyelids. A viral infection usually causes a watery discharge, while a bacterial infection causes a yellowish, thick discharge. Pink eye is very contagious and spreads by direct contact. You may be given antibiotic eyedrops as part of your treatment. Before using your eye medicine, remove all drainage from the eye by washing gently with warm water and cotton balls. Continue to use the medication until you have awakened 2 mornings in a row without discharge from the eye. Do not rub your eye. This increases the irritation and helps spread infection. Use separate towels from other household members. Wash your hands with soap and water before and after touching your eyes. Use cold compresses to reduce pain and sunglasses to relieve irritation from light. Do not wear contact lenses or wear eye makeup until the infection is gone. SEEK MEDICAL CARE IF:   Your symptoms are not better after 3 days of treatment.   You have increased pain or trouble seeing.   The outer eyelids become very red or swollen.  Document Released: 01/16/2005 Document Revised: 11/28/2011 Document Reviewed: 12/09/2005 ExitCare Patient Information 2012 [employer]. 

## 2012-06-18 NOTE — Progress Notes (Signed)
  Subjective:    Patient ID: Nicholas Harding, male    DOB: June 23, 1986, 26 y.o.   MRN: [id number]  HPI Conjunctivitis x 1 week.  L eye.  Baseline hx/o mild MR and autism.  Pt is here with grandmother.  + L eye tearing and crusting.  No purulent drainage.  No fevers, chills, change in baseline mental status Vision is at baseline per grandmother.  Currently in weekly day care.    Review of Systems See HPI, otherwise ROS negative     Objective:   Physical Exam  Constitutional: He appears well-developed and well-nourished.  HENT:  Head: Normocephalic and atraumatic.  Right Ear: External ear normal.  Left Ear: External ear normal.  Eyes: Pupils are equal, round, and reactive to light.    Neck: Normal range of motion. Neck supple.  Cardiovascular: Normal rate and regular rhythm.   Pulmonary/Chest: Effort normal and breath sounds normal.  Abdominal: Soft. Bowel sounds are normal.          Assessment & Plan:

## 2012-06-18 NOTE — Assessment & Plan Note (Signed)
L eye conjunctivitis. Likely viral.  Will start on empiric coverage.  Discussed supportive care and infectious red flags.  Handout given.  Follow up as needed.

## 2012-08-03 ENCOUNTER — Telehealth: Payer: Self-pay | Admitting: Family Medicine

## 2012-08-03 NOTE — Telephone Encounter (Signed)
Faxed order for speech assessment for augmentative communication device and referral to speech therapist: [name].  Also called [name] with [facility] to let her know that order was being faxed.

## 2012-08-04 ENCOUNTER — Telehealth: Payer: Self-pay | Admitting: Family Medicine

## 2012-08-04 NOTE — Telephone Encounter (Signed)
Called and spoke with Maralyn Sago. I faxed the pt's info to [phone].   I also told her, if they need more they would need an OV with new PCP Dr.Losq. Lorenda Hatchet, Renato Battles

## 2012-08-04 NOTE — Telephone Encounter (Signed)
Home Health Agency is calling because they need more information on the patient in order to start care.  They need a face sheet and H&P.  They only received an order.

## 2012-09-01 ENCOUNTER — Ambulatory Visit: Payer: Medicaid Other | Admitting: Family Medicine

## 2012-09-15 ENCOUNTER — Ambulatory Visit: Payer: Medicaid Other | Admitting: Family Medicine

## 2012-09-21 ENCOUNTER — Other Ambulatory Visit: Payer: Self-pay | Admitting: Family Medicine

## 2012-10-14 ENCOUNTER — Encounter: Payer: Self-pay | Admitting: Family Medicine

## 2012-10-14 ENCOUNTER — Ambulatory Visit (INDEPENDENT_AMBULATORY_CARE_PROVIDER_SITE_OTHER): Payer: Medicaid Other | Admitting: Family Medicine

## 2012-10-14 VITALS — BP 101/69 | HR 74 | Ht 65.0 in | Wt 178.0 lb

## 2012-10-14 DIAGNOSIS — H1045 Other chronic allergic conjunctivitis: Secondary | ICD-10-CM

## 2012-10-14 DIAGNOSIS — F840 Autistic disorder: Secondary | ICD-10-CM

## 2012-10-14 DIAGNOSIS — I10 Essential (primary) hypertension: Secondary | ICD-10-CM

## 2012-10-14 DIAGNOSIS — H1010 Acute atopic conjunctivitis, unspecified eye: Secondary | ICD-10-CM

## 2012-10-14 DIAGNOSIS — Z23 Encounter for immunization: Secondary | ICD-10-CM

## 2012-10-14 DIAGNOSIS — R234 Changes in skin texture: Secondary | ICD-10-CM

## 2012-10-14 MED ORDER — UREA 40 % EX CREA: 1.0000 "application " | TOPICAL_CREAM | Freq: Every day | CUTANEOUS | Status: DC

## 2012-10-14 MED ORDER — OLOPATADINE HCL 0.1 % OP SOLN: 1.0000 [drp] | Freq: Two times a day (BID) | OPHTHALMIC | Status: DC

## 2012-10-15 NOTE — Assessment & Plan Note (Signed)
Request for referral to speech therapy at [facility] with Burgess Estelle (fax: [phone], tel: [phone]) for work with communication device: Aeronautical engineer. Will send in referral as this may improve his ability to communicate.

## 2012-10-15 NOTE — Assessment & Plan Note (Signed)
Refilled patanol.

## 2012-10-15 NOTE — Progress Notes (Signed)
Patient ID: Nicholas Harding    DOB: [birth year], 26 y.o.   MRN: [id number] --- Subjective:  Nicholas Harding is a 26 y.o.male with non-verbal autism who presents accompanied by his mother and his case worker for a referral for speech therapy. The speech therapist at school has worked with him with a communication device called [employer] Gwynn Burly, which has helped with his communication. The school speech therapist therefore recommended that he be referred for speech therapy to work on this.  - hypertension: well controled. Currently on HCTZ 12.5 and clonidine 0.1mg . No evidence of dizziness or lightheadedness, suggesting hypotension. No swelling or increased work of breathing.   ROS: see HPI Past Medical History: reviewed and updated medications and allergies. Social History: Tobacco: denies  Objective: Filed Vitals:   10/14/12 1533  BP: 101/69  Pulse: 74    Physical Examination:   General appearance - alert, non verbal, well groomed, cooperative with exam Chest - clear to auscultation, no wheezes, rales or rhonchi, symmetric air entry Heart - normal rate, regular rhythm, normal S1, S2, no murmurs, rubs, clicks or gallops Abdomen - soft, nontender, nondistended, no masses or organomegaly Extremities - peripheral pulses normal, no pedal edema

## 2012-10-15 NOTE — Assessment & Plan Note (Addendum)
Blood pressure consistent from previous visit in June. Currently 101/69. Mother and caregiver deny any signs or symptoms of hypotension. Will continue with clonidine and hctz for now but will have low threshold on decreasing coverage if he starts becoming symptomatic.

## 2012-10-21 ENCOUNTER — Telehealth: Payer: Self-pay | Admitting: Family Medicine

## 2012-10-21 NOTE — Telephone Encounter (Signed)
Faxed info to [phone]

## 2012-10-21 NOTE — Telephone Encounter (Signed)
Home Health needs demographic and History and Physical on the patient for PT to be able to go out.  They will also need his insurance info faxed to [phone].

## 2013-04-01 ENCOUNTER — Other Ambulatory Visit: Payer: Self-pay | Admitting: Family Medicine

## 2013-04-01 NOTE — Telephone Encounter (Signed)
Faxing order for "augmentative communication device" to [name] and Leretha Pol at [facility] who have been working with Manufacturing systems engineer with [employer].   Marena Chancy, PGY-2 Family Medicine Resident

## 2013-04-19 ENCOUNTER — Other Ambulatory Visit: Payer: Self-pay | Admitting: *Deleted

## 2013-04-19 DIAGNOSIS — I10 Essential (primary) hypertension: Secondary | ICD-10-CM

## 2013-04-19 MED ORDER — HYDROCHLOROTHIAZIDE 12.5 MG PO CAPS: 12.5000 mg | ORAL_CAPSULE | Freq: Every day | ORAL | Status: DC

## 2013-05-24 ENCOUNTER — Other Ambulatory Visit: Payer: Self-pay | Admitting: *Deleted

## 2013-05-24 DIAGNOSIS — K5900 Constipation, unspecified: Secondary | ICD-10-CM

## 2013-05-24 MED ORDER — POLYETHYLENE GLYCOL 3350 17 G PO PACK: 17.0000 g | PACK | Freq: Every day | ORAL | Status: DC | PRN

## 2013-06-02 ENCOUNTER — Encounter: Payer: Self-pay | Admitting: Family Medicine

## 2013-06-02 ENCOUNTER — Ambulatory Visit (INDEPENDENT_AMBULATORY_CARE_PROVIDER_SITE_OTHER): Payer: Medicaid Other | Admitting: Family Medicine

## 2013-06-02 VITALS — BP 125/79 | HR 73 | Ht 65.0 in | Wt 179.0 lb

## 2013-06-02 DIAGNOSIS — F840 Autistic disorder: Secondary | ICD-10-CM

## 2013-06-02 DIAGNOSIS — K5900 Constipation, unspecified: Secondary | ICD-10-CM

## 2013-06-02 DIAGNOSIS — R140 Abdominal distension (gaseous): Secondary | ICD-10-CM

## 2013-06-02 DIAGNOSIS — I10 Essential (primary) hypertension: Secondary | ICD-10-CM

## 2013-06-02 DIAGNOSIS — R141 Gas pain: Secondary | ICD-10-CM

## 2013-06-02 DIAGNOSIS — F980 Enuresis not due to a substance or known physiological condition: Secondary | ICD-10-CM

## 2013-06-02 DIAGNOSIS — R143 Flatulence: Secondary | ICD-10-CM

## 2013-06-02 LAB — CBC WITH DIFFERENTIAL/PLATELET
Basophils Absolute: 0 10*3/uL (ref 0.0–0.1)
Eosinophils Absolute: 0.3 10*3/uL (ref 0.0–0.7)
Eosinophils Relative: 5 % (ref 0–5)
HCT: 42.1 % (ref 39.0–52.0)
Lymphocytes Relative: 39 % (ref 12–46)
MCH: 29.8 pg (ref 26.0–34.0)
MCHC: 33.7 g/dL (ref 30.0–36.0)
MCV: 88.4 fL (ref 78.0–100.0)
Monocytes Absolute: 0.5 10*3/uL (ref 0.1–1.0)
Platelets: 254 10*3/uL (ref 150–400)
RDW: 12.8 % (ref 11.5–15.5)

## 2013-06-02 LAB — COMPLETE METABOLIC PANEL WITH GFR
ALT: 11 U/L (ref 0–53)
AST: 23 U/L (ref 0–37)
Albumin: 4.1 g/dL (ref 3.5–5.2)
Alkaline Phosphatase: 63 U/L (ref 39–117)
BUN: 14 mg/dL (ref 6–23)
Calcium: 9.2 mg/dL (ref 8.4–10.5)
Chloride: 103 mEq/L (ref 96–112)
Potassium: 3.9 mEq/L (ref 3.5–5.3)
Sodium: 138 mEq/L (ref 135–145)
Total Protein: 7.4 g/dL (ref 6.0–8.3)

## 2013-06-02 LAB — LIPID PANEL
HDL: 41 mg/dL (ref >39)
LDL Cholesterol: 90 mg/dL (ref 0–99)

## 2013-06-02 MED ORDER — HYDROCHLOROTHIAZIDE 12.5 MG PO CAPS: 12.5000 mg | ORAL_CAPSULE | Freq: Every day | ORAL | Status: DC

## 2013-06-02 MED ORDER — OXYBUTYNIN CHLORIDE 5 MG PO TABS: 10.0000 mg | ORAL_TABLET | Freq: Every day | ORAL | Status: DC

## 2013-06-02 MED ORDER — POLYETHYLENE GLYCOL 3350 17 G PO PACK: 17.0000 g | PACK | Freq: Every day | ORAL | Status: DC | PRN

## 2013-06-02 NOTE — Patient Instructions (Signed)
I will fax the bloodwork to [facility]. Follow up with me in 6 months-81months

## 2013-06-04 NOTE — Progress Notes (Signed)
Patient ID: Donaven Mentzer    DOB: [birth year], 27 y.o.   MRN: [id number] --- Subjective:  Tyresse is a 27 y.o.male with non verbal autistic disorder who presents with his mother for yearly physical.  She requests refills on hctz 12.5gmg, miralax and ditropan 5mg .  - constipation: controled with miralax daily. She does report some abdominal distention in the last 3 months. No obvious abdominal discomfort. There has been some weight gain.  - htn: on hctz 12.5mg  daily and clonidine 0.1mg . No obvious shortness of breath or chest pain.   ROS: see HPI Past Medical History: reviewed and updated medications and allergies. Social History: Tobacco: none  Objective: Filed Vitals:   06/02/13 1528  BP: 125/79  Pulse: 73    Physical Examination:   General appearance - alert, in no acute distress, calm, non verbal, cooperative with exam Neck - supple, no significant adenopathy Chest - clear to auscultation, no wheezes, rales or rhonchi, symmetric air entry Heart - normal rate, regular rhythm, normal S1, S2, no murmurs, rubs, clicks or gallops Abdomen - soft, nontender, distended, no obvious fluid wave, no hepatomegaly appreciated although exam is limited  Extremities - no lower extremity edema

## 2013-06-05 DIAGNOSIS — R140 Abdominal distension (gaseous): Secondary | ICD-10-CM | POA: Insufficient documentation

## 2013-06-05 NOTE — Assessment & Plan Note (Addendum)
Well controled on clonidine 0.1mg  and hctz 12.5mg . Continue current regimen. Obtain yearly BMP and lipid panel.  Will fax results to patient's nurse at: [facility]: Brock Bad: fax: [phone], tel: [phone]

## 2013-06-05 NOTE — Assessment & Plan Note (Signed)
Refill miralax

## 2013-06-05 NOTE — Assessment & Plan Note (Signed)
Could be from gaseous distention, but since exam is limited in non verbal patient, will obtain LFT's and abdominal ultrasound to rule out any hepatic component or ascites.

## 2013-06-05 NOTE — Assessment & Plan Note (Addendum)
Refilled oxybutinin 5mg  two tabs qhs, which helps with symptoms.

## 2013-06-08 ENCOUNTER — Encounter: Payer: Self-pay | Admitting: Family Medicine

## 2013-06-08 ENCOUNTER — Ambulatory Visit (HOSPITAL_COMMUNITY)
Admission: RE | Admit: 2013-06-08 | Discharge: 2013-06-08 | Disposition: A | Discharge status: Home / Self Care | Payer: Medicaid Other | Source: Ambulatory Visit | Attending: Family Medicine | Admitting: Family Medicine

## 2013-06-08 ENCOUNTER — Other Ambulatory Visit: Payer: Self-pay | Admitting: Family Medicine

## 2013-06-08 DIAGNOSIS — R142 Eructation: Secondary | ICD-10-CM | POA: Insufficient documentation

## 2013-06-08 DIAGNOSIS — R141 Gas pain: Secondary | ICD-10-CM | POA: Insufficient documentation

## 2013-06-08 DIAGNOSIS — R140 Abdominal distension (gaseous): Secondary | ICD-10-CM

## 2013-06-08 DIAGNOSIS — R143 Flatulence: Secondary | ICD-10-CM | POA: Insufficient documentation

## 2013-06-11 ENCOUNTER — Encounter: Payer: Self-pay | Admitting: Family Medicine

## 2013-06-11 DIAGNOSIS — F840 Autistic disorder: Secondary | ICD-10-CM

## 2013-07-21 ENCOUNTER — Telehealth: Payer: Self-pay | Admitting: Family Medicine

## 2013-07-21 NOTE — Telephone Encounter (Signed)
Mom is calling asking for the patient blood work results to be faxed to [facility] [phone], attention: Dr. Weldon Picking. Greninger.  She would also like a copy mailed to her.

## 2013-07-21 NOTE — Telephone Encounter (Signed)
Lab results faxed and mailed.Busick, Rodena Medin

## 2013-09-29 ENCOUNTER — Telehealth: Payer: Self-pay | Admitting: Family Medicine

## 2013-09-29 NOTE — Telephone Encounter (Signed)
Patient needs an up-to-date referral for speech evaluation sent to [facility], Care Coordinator at [facility]. Fax- [phone]

## 2013-09-29 NOTE — Telephone Encounter (Signed)
Will fwd to MD for review and approval.  If approved, will call number listed below.   Jahsir Rama, Darlyne Russian, CMA

## 2013-09-29 NOTE — Telephone Encounter (Signed)
Wrote prescription for referral for speech therapy and sent it to [facility], Care Coordinator at [facility]. Fax- [phone]  Marena Chancy, PGY-3 Family Medicine Resident

## 2013-10-01 ENCOUNTER — Ambulatory Visit: Payer: Medicaid Other

## 2013-10-04 ENCOUNTER — Encounter: Payer: Self-pay | Admitting: *Deleted

## 2013-10-04 ENCOUNTER — Ambulatory Visit (INDEPENDENT_AMBULATORY_CARE_PROVIDER_SITE_OTHER): Payer: Medicaid Other | Admitting: *Deleted

## 2013-10-04 DIAGNOSIS — Z23 Encounter for immunization: Secondary | ICD-10-CM

## 2013-10-05 DIAGNOSIS — Z23 Encounter for immunization: Secondary | ICD-10-CM

## 2013-12-31 ENCOUNTER — Telehealth: Payer: Self-pay | Admitting: *Deleted

## 2013-12-31 NOTE — Telephone Encounter (Signed)
Patient has an appt at [facility] for speech therapy evaluation.  Due to patient having Medicaid, office requesting NPI #  to authorize appt.  NPI # given for 1 visit.  Office will call back if additional visits are needed.    Gaylene Brooksichardson, Jeannette Ann, RN

## 2014-03-22 ENCOUNTER — Telehealth: Payer: Self-pay | Admitting: Family Medicine

## 2014-03-22 ENCOUNTER — Other Ambulatory Visit: Payer: Self-pay | Admitting: *Deleted

## 2014-03-22 DIAGNOSIS — I10 Essential (primary) hypertension: Secondary | ICD-10-CM

## 2014-03-22 MED ORDER — HYDROCHLOROTHIAZIDE 12.5 MG PO CAPS: 12.5000 mg | ORAL_CAPSULE | Freq: Every day | ORAL | Status: DC

## 2014-03-22 NOTE — Telephone Encounter (Signed)
Pt called and needs a refill on his BP medication right away. He is out. Myriam Jacobsonjw

## 2014-03-22 NOTE — Telephone Encounter (Signed)
Will fwd to PCP for refill .Nicholas Harding  

## 2014-03-22 NOTE — Telephone Encounter (Signed)
Please let patient know that Rx for hydrochlorothiazide 12.5mg  was sent to his pharmacy: [facility] on [address]  Thank you!  Marena ChancyStephanie Mayela Bullard, PGY-3 Family Medicine Resident

## 2014-03-23 NOTE — Telephone Encounter (Signed)
Pt's mother notified.  Meranda Dechaine, Darlyne RussianKristen L, CMA

## 2014-06-02 ENCOUNTER — Ambulatory Visit (INDEPENDENT_AMBULATORY_CARE_PROVIDER_SITE_OTHER): Payer: Medicaid Other | Admitting: Family Medicine

## 2014-06-02 ENCOUNTER — Encounter: Payer: Self-pay | Admitting: Family Medicine

## 2014-06-02 VITALS — BP 120/82 | HR 59 | Ht 65.0 in | Wt 170.0 lb

## 2014-06-02 DIAGNOSIS — I10 Essential (primary) hypertension: Secondary | ICD-10-CM

## 2014-06-02 DIAGNOSIS — F840 Autistic disorder: Secondary | ICD-10-CM

## 2014-06-02 DIAGNOSIS — E669 Obesity, unspecified: Secondary | ICD-10-CM

## 2014-06-02 DIAGNOSIS — Z0000 Encounter for general adult medical examination without abnormal findings: Secondary | ICD-10-CM

## 2014-06-02 LAB — CBC
HCT: 43.3 % (ref 39.0–52.0)
HEMOGLOBIN: 14.9 g/dL (ref 13.0–17.0)
MCH: 30.5 pg (ref 26.0–34.0)
MCHC: 34.4 g/dL (ref 30.0–36.0)
MCV: 88.5 fL (ref 78.0–100.0)
Platelets: 227 10*3/uL (ref 150–400)
RBC: 4.89 MIL/uL (ref 4.22–5.81)
RDW: 13.1 % (ref 11.5–15.5)
WBC: 4.6 10*3/uL (ref 4.0–10.5)

## 2014-06-02 MED ORDER — OXYBUTYNIN CHLORIDE 5 MG PO TABS: 10.0000 mg | ORAL_TABLET | Freq: Every day | ORAL | Status: DC

## 2014-06-02 NOTE — Patient Instructions (Signed)
Follow up in 1 year.

## 2014-06-03 LAB — COMPREHENSIVE METABOLIC PANEL
ALK PHOS: 67 U/L (ref 39–117)
ALT: 9 U/L (ref 0–53)
AST: 20 U/L (ref 0–37)
Albumin: 4.3 g/dL (ref 3.5–5.2)
BILIRUBIN TOTAL: 0.4 mg/dL (ref 0.2–1.2)
BUN: 9 mg/dL (ref 6–23)
CO2: 30 mEq/L (ref 19–32)
CREATININE: 0.92 mg/dL (ref 0.50–1.35)
Calcium: 9.3 mg/dL (ref 8.4–10.5)
Chloride: 104 mEq/L (ref 96–112)
GLUCOSE: 78 mg/dL (ref 70–99)
Potassium: 3.9 mEq/L (ref 3.5–5.3)
SODIUM: 142 meq/L (ref 135–145)
TOTAL PROTEIN: 7.1 g/dL (ref 6.0–8.3)

## 2014-06-06 NOTE — Assessment & Plan Note (Signed)
-   will obtain A1C, CMP to check for Cr in setting of HTN and LFT's in setting of risperdal use. Patient had lipid panel done last year which was normal and therefore doesn't need to be rechecked this year. Will also check CBC in setting of risperdal use.  - up to date on immunizations

## 2014-06-06 NOTE — Assessment & Plan Note (Signed)
Well controled.  Continue hctz 12.5 daily.  Check CMP today

## 2014-06-06 NOTE — Progress Notes (Signed)
28 y.o. year old male with h/o autism, HTN who presents for yearly physical. He is brought to the clinic by his mother.  Acute Concerns: none Diet: Regular diet at the center Exercise: Plays basketball at the center, does daily physical activity as part of the daily activities.    Social:  History   Social History  . Marital Status: Single    Spouse Name: N/A    Number of Children: N/A  . Years of Education: N/A   Social History Main Topics  . Smoking status: Never Smoker   . Smokeless tobacco: Never Used  . Alcohol Use: No  . Drug Use: No  . Sexual Activity: No   Other Topics Concern  . None   Social History Narrative  . None    Immunization:  Tdap/TD: 01/23/2007  Influenza:10/05/13  Pneumococcal: NA  Herpes Zoster: NA  Cancer Screening:  Colonoscopy: NA  Review of Systems:  Denies any shortness of breath, chest pain, wheezing, lower extremity swelling. No constipation: has daily soft bowel movement on miralax. No dysuria. He does have enuresis which is chronic and controled with oxybutinin. Mild abdominal distention but unchanged from last year. No abdominal pain.  No worsening depression.  No rashes.  Physical Exam: Physical Examination: General appearance - alert, well appearing, and in no distress, non verbal Eyes - pupils equal and reactive, extraocular eye movements intact Ears - bilateral TM's and external ear canals normal Nose - normal and patent, no erythema, discharge or polyps Mouth - mucous membranes moist, oropharynx not well visualized due to lack of compliance with opening mouth Neck - supple, no significant adenopathy Chest - clear to auscultation, no wheezes, rales or rhonchi, symmetric air entry Heart - normal rate, regular rhythm, normal S1, S2, no murmurs, rubs, clicks or gallops Abdomen - soft, nontender, mildly distended but unchanged from prior exam, no masses or organomegaly Extremities - no edema Skin - normal coloration and turgor,  no rashes, no suspicious skin lesions noted   ASSESSMENT & PLAN: 28 y.o. male with h/o HTN, autism who presents for yearly physical.   # Yearly physical:  - will obtain A1C, CMP to check for Cr in setting of HTN and LFT's in setting of risperdal use. Patient had lipid panel done last year which was normal and therefore doesn't need to be rechecked this year. Will also check CBC in setting of risperdal use.  - up to date on immunizations  Marena ChancyStephanie Garrett Mitchum, PGY-3 Family Medicine Resident

## 2014-06-07 ENCOUNTER — Other Ambulatory Visit: Payer: Self-pay | Admitting: *Deleted

## 2014-06-07 ENCOUNTER — Encounter: Payer: Self-pay | Admitting: Family Medicine

## 2014-06-07 DIAGNOSIS — K5900 Constipation, unspecified: Secondary | ICD-10-CM

## 2014-06-07 MED ORDER — POLYETHYLENE GLYCOL 3350 17 G PO PACK: 17.0000 g | PACK | Freq: Every day | ORAL | Status: DC | PRN

## 2014-06-21 ENCOUNTER — Other Ambulatory Visit: Payer: Self-pay | Admitting: Family Medicine

## 2014-09-05 ENCOUNTER — Encounter: Payer: Self-pay | Admitting: Family Medicine

## 2014-09-05 ENCOUNTER — Ambulatory Visit (INDEPENDENT_AMBULATORY_CARE_PROVIDER_SITE_OTHER): Payer: Medicare Other | Admitting: Family Medicine

## 2014-09-05 VITALS — BP 149/70 | HR 79 | Ht 65.0 in | Wt 180.0 lb

## 2014-09-05 DIAGNOSIS — B009 Herpesviral infection, unspecified: Secondary | ICD-10-CM | POA: Diagnosis not present

## 2014-09-05 DIAGNOSIS — B001 Herpesviral vesicular dermatitis: Secondary | ICD-10-CM

## 2014-09-05 DIAGNOSIS — Z23 Encounter for immunization: Secondary | ICD-10-CM | POA: Diagnosis present

## 2014-09-05 MED ORDER — ACYCLOVIR 5 % EX CREA
1.0000 | TOPICAL_CREAM | Freq: Every day | CUTANEOUS | Status: DC
Start: 2014-09-05 — End: 2015-06-14

## 2014-09-05 NOTE — Patient Instructions (Signed)
Nice to meet you. Please use the cream on the lip. If the sore gets worse, there becomes more pain, or redness please come back in for follow-up.

## 2014-09-06 DIAGNOSIS — B001 Herpesviral vesicular dermatitis: Secondary | ICD-10-CM | POA: Insufficient documentation

## 2014-09-06 NOTE — Assessment & Plan Note (Addendum)
Patient with an apparent cold sore on his bottom lip. Will treat empirically with acyclovir cream. Discussed treating with cream vs pills, and given ease of applying cream and previous success of cream vs giving pills 5x daily, the care giver decided on cream as initial treatment. If he does not have a response would consider acyclovir PO as treatment. Consider HSV1 viral testing in the future to confirm the diagnosis. Given return precautions.

## 2014-09-06 NOTE — Progress Notes (Signed)
Patient ID: Johnnette Litter Prindiville, male   DOB: 12/13/1986, 28 y.o.   MRN: [id number]  Marikay Alar, MD Phone: [phone]  Americo Weigand is a 28 y.o. male who presents today for same day appointment.  Cold sore: patients care giver reports she noticed the cold sore on the patients lower lip last night. He has had these before and has been treated with a cream per her report, though she can not remember the name of the cream. His last episode of this was a year ago. He has been eating and drinking well. Has not had any fevers. Is only on the bottom lip.  Patient is a nonsmoker.   ROS: Per HPI   Physical Exam Filed Vitals:   09/05/14 1543  BP: 149/70  Pulse: 79    Gen: Well NAD, autistic HEENT: patient would not open his mother to allow inspection of the oral cavity Skin: mid lower lip with small ulceration with mild overlying crusting, there is no surrounding redness, there are no other lesions, it is difficult to palpate the lesion given the patients autism and that he won't allow palpation of the area   Assessment/Plan: Please see individual problem list.   Marikay Alar, MD [facility] PGY-3

## 2014-09-08 ENCOUNTER — Telehealth: Payer: Self-pay | Admitting: Family Medicine

## 2014-09-08 DIAGNOSIS — F840 Autistic disorder: Secondary | ICD-10-CM

## 2014-09-08 NOTE — Telephone Encounter (Signed)
Mother called and needs another referral for her son to see the [facility] at [facility] and Speech [phone]. Please let mom know when she can schedule an appointment. jw

## 2014-09-09 NOTE — Telephone Encounter (Signed)
Referral entered. Please let mom know when it is completed.

## 2014-09-13 ENCOUNTER — Telehealth: Payer: Self-pay | Admitting: Family Medicine

## 2014-09-13 NOTE — Telephone Encounter (Signed)
Made updates in referral and refaxed to new number given. Jazmin Hartsell,CMA

## 2014-09-13 NOTE — Telephone Encounter (Signed)
Mother called and the referral fax that was sent needs adjusting. It has to say the name of the speech [facility] [place] and also they need to know that her son can have a device that he can use to communicate with. Also the new fax number is [phone]. Myriam Jacobson

## 2014-09-13 NOTE — Telephone Encounter (Signed)
Referral given to [facility] and faxed to [phone]. Mother is aware that this was taken care of and will check with case manager to see when they can get there for an appt. She is aware to call them to schedule this. Jazmin Hartsell,CMA

## 2014-11-16 ENCOUNTER — Encounter: Payer: Self-pay | Admitting: Family Medicine

## 2014-11-16 ENCOUNTER — Ambulatory Visit (INDEPENDENT_AMBULATORY_CARE_PROVIDER_SITE_OTHER): Payer: Medicare Other | Admitting: Family Medicine

## 2014-11-16 VITALS — BP 114/72 | HR 82 | Temp 98.2°F | Ht 65.0 in | Wt 188.8 lb

## 2014-11-16 DIAGNOSIS — F840 Autistic disorder: Secondary | ICD-10-CM

## 2014-11-16 NOTE — Patient Instructions (Signed)
I have faxed the requested referral to Atha StarksKaren Downes, please let me know if there are any problems with this.

## 2014-11-16 NOTE — Progress Notes (Signed)
   Subjective:    Patient ID: Nicholas Harding, male    DOB: 1986/04/03, 28 y.o.   MRN: [id number]  HPI Pt presents for referral for augmentative communication device recommended by his speech therapist. He has been seeing [facility] for years and she knows him well and believed this well help him communicate more effectively given his largely nonverbal state and severity of his autistic disorder   Review of Systems  Neurological: Positive for speech difficulty.  All other systems reviewed and are negative.      Objective:   Physical Exam  Constitutional: He appears well-developed and well-nourished. No distress.  HENT:  Head: Normocephalic and atraumatic.  Eyes: Conjunctivae are normal. Right eye exhibits no discharge. Left eye exhibits no discharge. No scleral icterus.  Cardiovascular: Normal rate.   Pulmonary/Chest: Effort normal.  Abdominal: He exhibits no distension.  Neurological: He is alert. Coordination normal.  Skin: Skin is warm and dry. No rash noted. He is not diaphoretic.  Psychiatric: He has a normal mood and affect.  Nursing note and vitals reviewed.         Assessment & Plan:

## 2014-11-16 NOTE — Assessment & Plan Note (Signed)
DME order and referral faxed to [facility] for augmentative communication device (Dynavox Sar[employer]ogaMaestro)

## 2015-02-10 IMAGING — US US ABDOMEN LIMITED
1 series · 8 of 8 positions shown · non-contrast
Comparison: None.

CLINICAL DATA: Abdominal distention.

LIMITED ABDOMEN ULTRASOUND FOR ASCITES
TECHNIQUE: Limited ultrasound survey for ascites was performed in
all four abdominal quadrants.

[Series 1: us abdomen limited · 0.31mm/px · 8 of 8 slices shown]
[im 1/8]
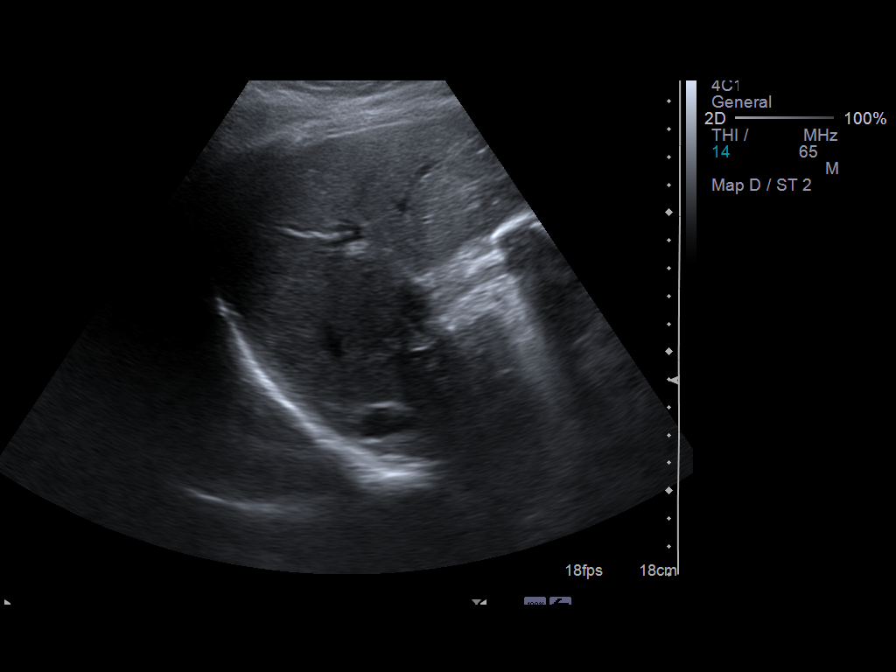
[im 2/8]
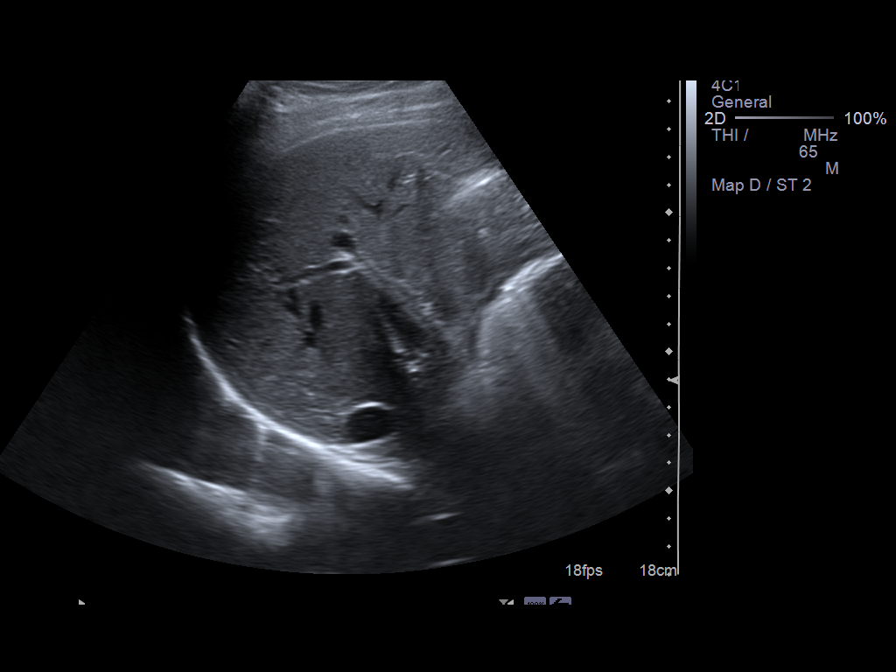
[im 3/8]
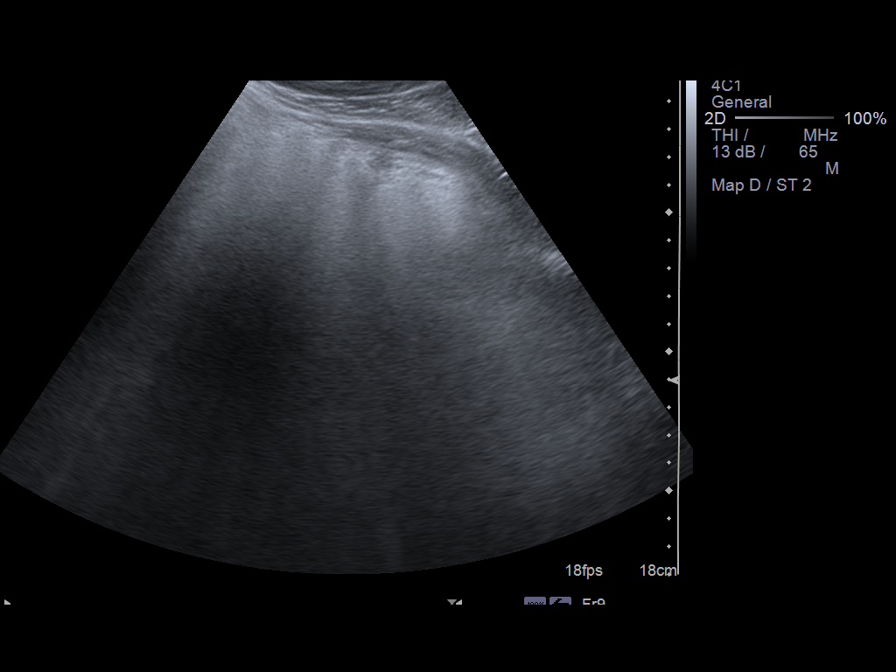
[im 4/8]
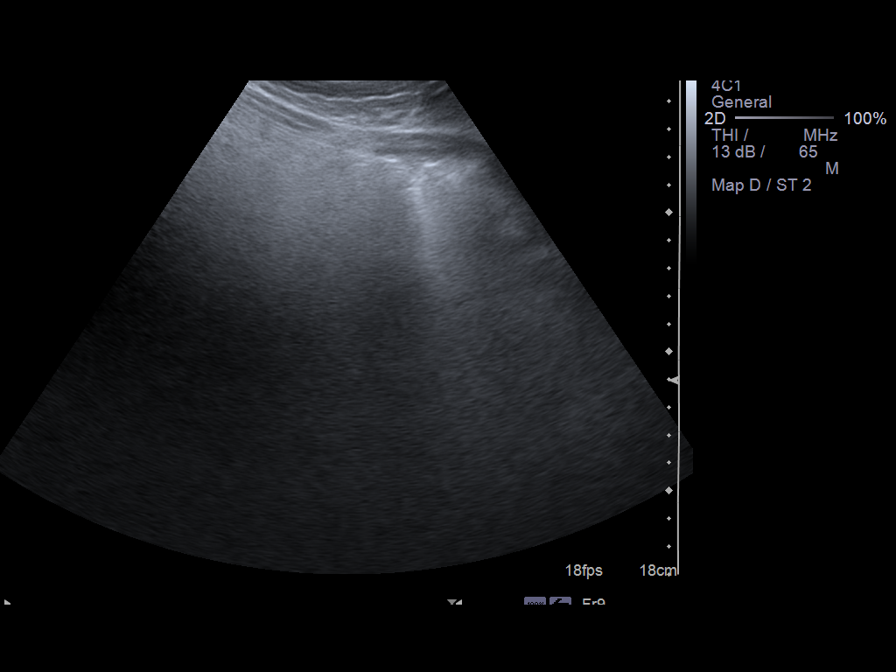
[im 5/8]
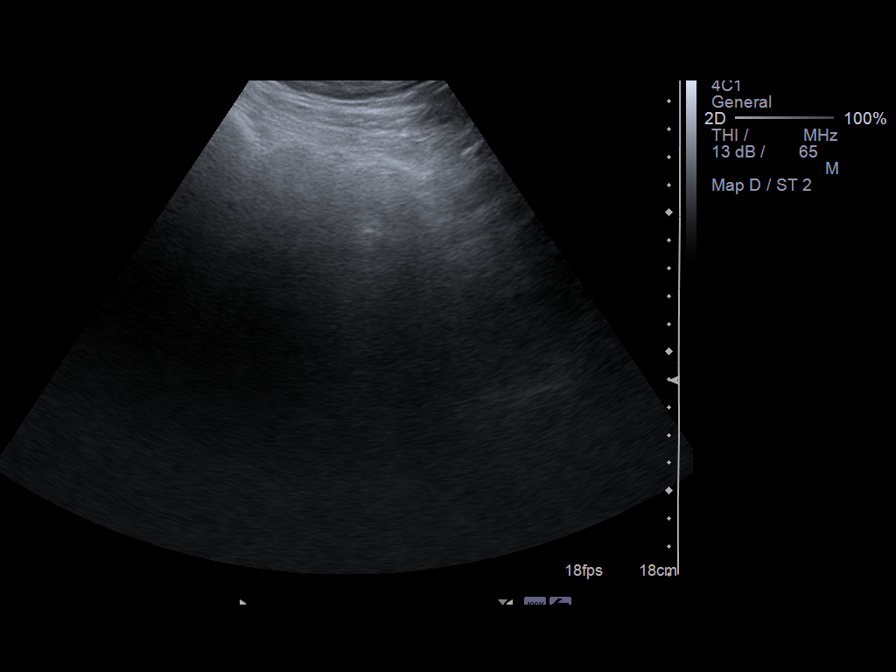
[im 6/8]
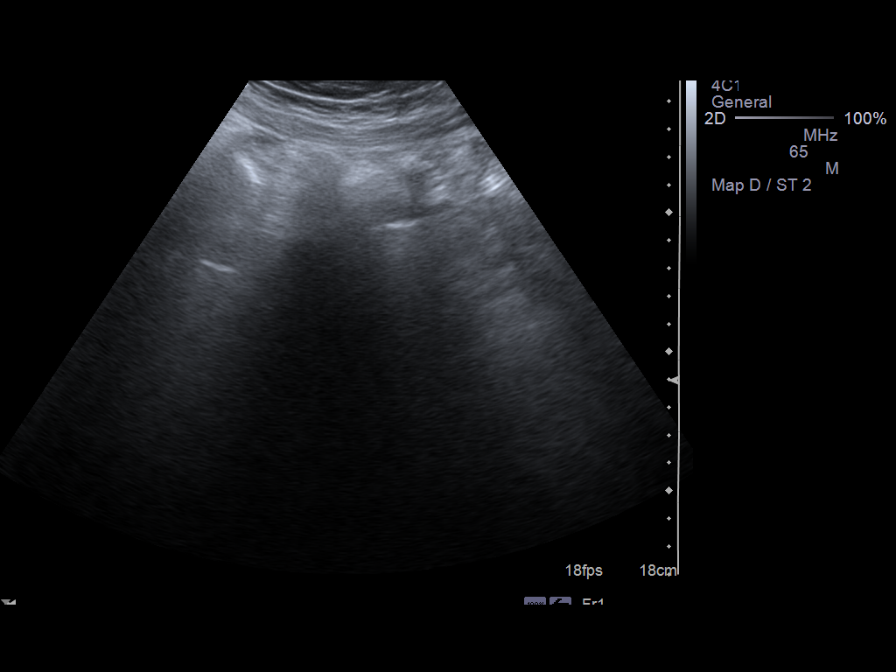
[im 7/8]
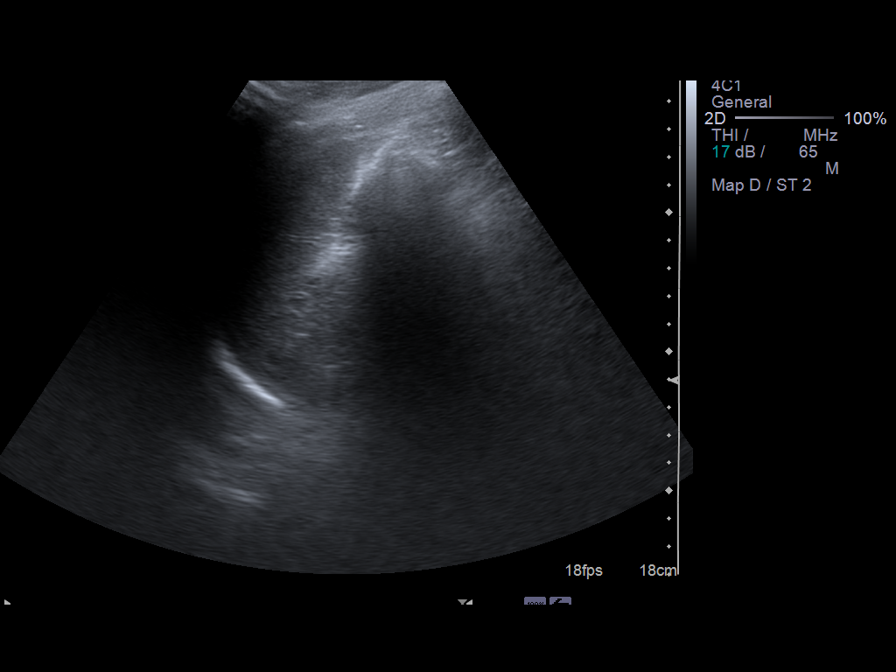
[im 8/8]
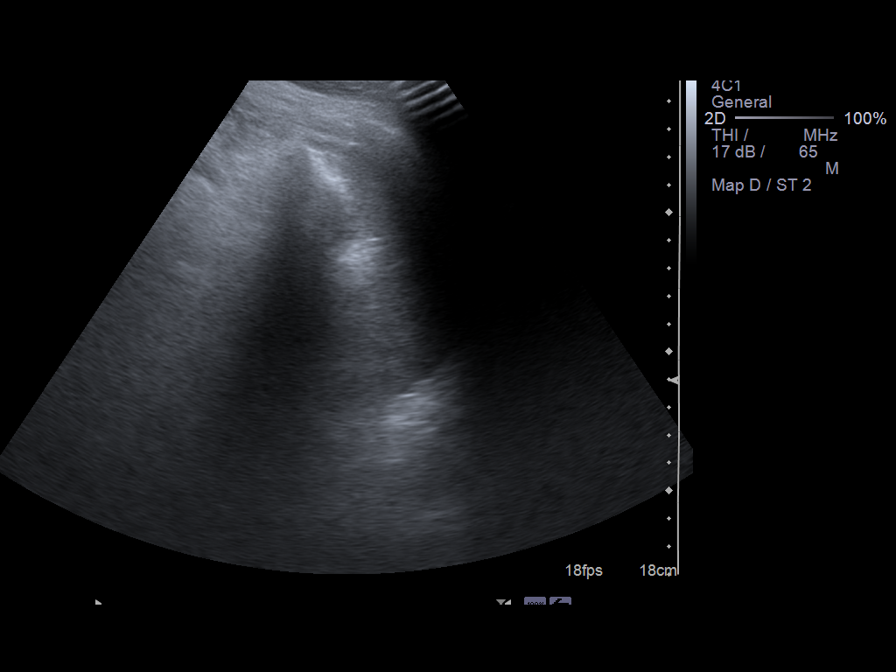

[8 of 8 positions shown; findings below may reference images not displayed]

FINDINGS: Four-quadrant survey shows no ascites in the peritoneal
cavity.  There are no incidental ultrasound findings.
IMPRESSION: No evidence of ascites.

## 2015-03-23 ENCOUNTER — Other Ambulatory Visit: Payer: Self-pay | Admitting: *Deleted

## 2015-03-23 DIAGNOSIS — I10 Essential (primary) hypertension: Secondary | ICD-10-CM

## 2015-03-23 MED ORDER — HYDROCHLOROTHIAZIDE 12.5 MG PO CAPS: 12.5000 mg | ORAL_CAPSULE | Freq: Every day | ORAL | Status: DC

## 2015-03-23 NOTE — Telephone Encounter (Signed)
2nd request.  Lavena Loretto L, RN  

## 2015-05-31 ENCOUNTER — Encounter: Payer: Self-pay | Admitting: Family Medicine

## 2015-05-31 NOTE — Progress Notes (Signed)
Covering PCP Dr. Cherre HugerAdamo's inbox. Completed form and signed, dated 05/31/15.  Returned to [employer] Martin RN to notify family for pickup.  Saralyn PilarAlexander Caelie Remsburg, DO [facility], PGY-2

## 2015-05-31 NOTE — Progress Notes (Signed)
Form placed in PCP box 

## 2015-05-31 NOTE — Progress Notes (Signed)
Pt's caretaker informed that form is ready for pickup. Clovis PuMartin, Tamika L, RN

## 2015-05-31 NOTE — Progress Notes (Signed)
Hattie Graves dropped off consent form to be signed for patient to receive OTC meds

## 2015-06-14 ENCOUNTER — Encounter: Payer: Self-pay | Admitting: Family Medicine

## 2015-06-14 ENCOUNTER — Ambulatory Visit (INDEPENDENT_AMBULATORY_CARE_PROVIDER_SITE_OTHER): Payer: Medicare Other | Admitting: Family Medicine

## 2015-06-14 VITALS — BP 113/65 | HR 88 | Temp 98.3°F | Ht 65.0 in | Wt 178.4 lb

## 2015-06-14 DIAGNOSIS — R234 Changes in skin texture: Secondary | ICD-10-CM

## 2015-06-14 DIAGNOSIS — Z0000 Encounter for general adult medical examination without abnormal findings: Secondary | ICD-10-CM

## 2015-06-14 DIAGNOSIS — K5900 Constipation, unspecified: Secondary | ICD-10-CM

## 2015-06-14 DIAGNOSIS — F980 Enuresis not due to a substance or known physiological condition: Secondary | ICD-10-CM

## 2015-06-14 DIAGNOSIS — F330 Major depressive disorder, recurrent, mild: Secondary | ICD-10-CM

## 2015-06-14 DIAGNOSIS — I10 Essential (primary) hypertension: Secondary | ICD-10-CM

## 2015-06-14 MED ORDER — UREA 40 % EX CREA: 1.0000 "application " | TOPICAL_CREAM | Freq: Every day | CUTANEOUS | Status: DC

## 2015-06-14 MED ORDER — OXYBUTYNIN CHLORIDE 5 MG PO TABS: 10.0000 mg | ORAL_TABLET | Freq: Every day | ORAL | Status: DC

## 2015-06-14 MED ORDER — POLYETHYLENE GLYCOL 3350 17 G PO PACK: PACK | ORAL | Status: DC

## 2015-06-14 NOTE — Patient Instructions (Signed)

## 2015-06-14 NOTE — Progress Notes (Signed)
   Subjective:    Patient ID: Nicholas Harding, male    DOB: Oct 01, 1986, 29 y.o.   MRN: [id number]  HPI Pt presents for annual physical exam. Per caregiver he is doing well with complaints. She has no concerns about his health. His mental/psychiatric care is being managed through the [facility] and has been stable. He is not experiencing any side effects from his antihypertensives. His enuresis is well control on ditropan.   He is not sexually active and does not smoke, drink alcohol, or    Review of Systems  All other systems reviewed and are negative.      Objective:   Physical Exam  Constitutional: He is oriented to person, place, and time. He appears well-developed and well-nourished. No distress.  HENT:  Head: Normocephalic and atraumatic.  Eyes: Conjunctivae are normal. Right eye exhibits no discharge. Left eye exhibits no discharge.  Neck: Normal range of motion. Neck supple. No thyromegaly present.  Cardiovascular: Normal rate, regular rhythm, normal heart sounds and intact distal pulses.   No murmur heard. Pulmonary/Chest: Effort normal and breath sounds normal. No respiratory distress. He has no wheezes.  Abdominal: Soft. Bowel sounds are normal. He exhibits no distension. There is no tenderness.  Musculoskeletal: Normal range of motion. He exhibits no edema or tenderness.  Lymphadenopathy:    He has no cervical adenopathy.  Neurological: He is alert and oriented to person, place, and time.  Skin: Skin is warm and dry. No rash noted. He is not diaphoretic.  Diffuse thickening  Psychiatric:  Nonverbal, follows some commands  Nursing note and vitals reviewed.         Assessment & Plan:  UTD on health maintenance

## 2015-06-15 ENCOUNTER — Other Ambulatory Visit: Payer: Self-pay | Admitting: Family Medicine

## 2015-06-15 ENCOUNTER — Telehealth: Payer: Self-pay | Admitting: Family Medicine

## 2015-06-15 DIAGNOSIS — I10 Essential (primary) hypertension: Secondary | ICD-10-CM

## 2015-06-15 NOTE — Telephone Encounter (Signed)
Future lab orders entered. Please let mom know.

## 2015-06-15 NOTE — Telephone Encounter (Signed)
Mother called because her son had a physical yesterday 6/22, but the [facility] did not do any lab work. She wants the [facility] to put in orders so that she can come back tomorrow and have lab work done. jw

## 2015-06-15 NOTE — Assessment & Plan Note (Signed)
Well controlled - continue urea cream

## 2015-06-15 NOTE — Assessment & Plan Note (Signed)
Managed by the [facility]

## 2015-06-15 NOTE — Telephone Encounter (Signed)
Patient mother informed will call back to schedule lab appointment.

## 2015-06-15 NOTE — Assessment & Plan Note (Signed)
Well controlled - refill hctz 12.5mg  daily

## 2015-06-20 ENCOUNTER — Other Ambulatory Visit: Payer: Medicare Other

## 2015-06-20 DIAGNOSIS — I10 Essential (primary) hypertension: Secondary | ICD-10-CM

## 2015-06-20 LAB — COMPREHENSIVE METABOLIC PANEL
ALK PHOS: 59 U/L (ref 39–117)
AST: 19 U/L (ref 0–37)
Albumin: 4 g/dL (ref 3.5–5.2)
BILIRUBIN TOTAL: 0.4 mg/dL (ref 0.2–1.2)
BUN: 11 mg/dL (ref 6–23)
CO2: 24 meq/L (ref 19–32)
CREATININE: 1.05 mg/dL (ref 0.50–1.35)
Calcium: 9 mg/dL (ref 8.4–10.5)
Chloride: 103 mEq/L (ref 96–112)
Glucose, Bld: 75 mg/dL (ref 70–99)
Potassium: 4 mEq/L (ref 3.5–5.3)
SODIUM: 140 meq/L (ref 135–145)
TOTAL PROTEIN: 6.9 g/dL (ref 6.0–8.3)

## 2015-06-20 LAB — CBC
HEMATOCRIT: 42.6 % (ref 39.0–52.0)
HEMOGLOBIN: 14.5 g/dL (ref 13.0–17.0)
MCH: 30.5 pg (ref 26.0–34.0)
MCHC: 34 g/dL (ref 30.0–36.0)
MCV: 89.7 fL (ref 78.0–100.0)
MPV: 10.3 fL (ref 8.6–12.4)
Platelets: 232 10*3/uL (ref 150–400)
RBC: 4.75 MIL/uL (ref 4.22–5.81)
RDW: 13.5 % (ref 11.5–15.5)
WBC: 5.1 10*3/uL (ref 4.0–10.5)

## 2015-06-20 NOTE — Progress Notes (Signed)
cmp and cbc done today Nicholas Harding 

## 2015-06-22 ENCOUNTER — Encounter: Payer: Self-pay | Admitting: *Deleted

## 2015-06-28 ENCOUNTER — Telehealth: Payer: Self-pay | Admitting: *Deleted

## 2015-06-28 NOTE — Telephone Encounter (Signed)
Prior Authorization received from [facility] pharmacy for Rea Lo cream. Tried to search for medication on medicaid formulary, but medication is not listed. Clovis PuMartin, Rudy Domek L, RN

## 2016-03-27 ENCOUNTER — Other Ambulatory Visit: Payer: Self-pay | Admitting: Family Medicine

## 2016-03-27 DIAGNOSIS — I10 Essential (primary) hypertension: Secondary | ICD-10-CM

## 2016-03-27 NOTE — Telephone Encounter (Signed)
Mother wants the medication sent to their NEW PHARMAC[facility] [address] jw

## 2016-03-27 NOTE — Telephone Encounter (Signed)
Mother is calling for her son. He needs a refill on his Hydrochlorothiazide called in. jw

## 2016-03-28 MED ORDER — HYDROCHLOROTHIAZIDE 12.5 MG PO CAPS: 12.5000 mg | ORAL_CAPSULE | Freq: Every day | ORAL | Status: DC

## 2016-05-09 ENCOUNTER — Encounter: Payer: Self-pay | Admitting: Family Medicine

## 2016-05-09 ENCOUNTER — Ambulatory Visit (INDEPENDENT_AMBULATORY_CARE_PROVIDER_SITE_OTHER): Payer: Medicare Other | Admitting: Family Medicine

## 2016-05-09 VITALS — BP 127/79 | HR 79 | Temp 97.5°F | Wt 171.0 lb

## 2016-05-09 DIAGNOSIS — J069 Acute upper respiratory infection, unspecified: Secondary | ICD-10-CM | POA: Diagnosis present

## 2016-05-09 NOTE — Progress Notes (Signed)
   Subjective:    Patient ID: Nicholas Harding, male    DOB: 05-20-1986, 30 y.o.   MRN: [id number]  Seen for Same day visit for   CC: cough  This morning he was coughing.  Has been coughing for 1 day. Cough is: hacking cough Sputum production: none  Medications tried: nyquil flu  Has not been started on any new medicines.  He goes to an adult center everyday.  Mother is not sure if he may have been exposed to someone sick there.  No one at home with similar symptoms.  No history of season allergies.  No travel recently.   Symptoms Runny nose: no Mucous in back of throat: no Throat burning or reflux: no Wheezing or asthma: no Fever: mother reports he felt warm last night but no measured temperature  Chest Pain: none  Shortness of breath: none Leg swelling: none Hemoptysis: none Weight loss: none  PMH: mental retardation, active autistic disorder  SH: no alcohol or tobacco use  FH: none contributory   Review of Systems   See HPI for ROS. Objective:  BP 127/79 mmHg  Pulse 79  Temp(Src) 97.5 F (36.4 C) (Oral)  Wt 171 lb (77.565 kg)  General: NAD HEENT: clear conjunctiva, unable to visualize TM, MMM, no cervical LAD, EOMI, PERRL, nasal congestion Cardiac: RRR, normal heart sounds, no murmurs.  Respiratory: CTAB, normal effort Extremities: WWP. Skin: warm and dry, no rashes noted     Assessment & Plan:   URI (upper respiratory infection) Symptoms most suggestive of viral illness.  Did not have any episodes of coughing during exam.  - encouraged supportive care  - advised using debrox or mineral oil for his cerumen  - given indications for return.

## 2016-05-09 NOTE — Assessment & Plan Note (Signed)
Symptoms most suggestive of viral illness.  Did not have any episodes of coughing during exam.  - encouraged supportive care  - advised using debrox or mineral oil for his cerumen  - given indications for return.

## 2016-05-09 NOTE — Patient Instructions (Signed)
Thank you for coming in,   Most likely he has a viral illness.   You can continue to give him cough medicine if his cough is waking him up.   You can try debrox or mineral oil to help soften the wax in his ears.   If he gets worse or develops a high fever then please follow up with us.    Please bring all of your medications with you to each visit.   Health maintenance items that are due.  Health Maintenance  Topic Date Due  . HIV Screening  04/19/2000  . Flu Shot  07/23/2016  . Tetanus Vaccine  01/23/2017     Sign up for My Chart to have easy access to your labs results, and communication with your Primary care physician   Please feel free to call with any questions or concerns at any time, at [phone]. --Dr. Jordan LikesSchmitz

## 2016-06-18 ENCOUNTER — Encounter: Payer: Self-pay | Admitting: Family Medicine

## 2016-06-18 ENCOUNTER — Ambulatory Visit (INDEPENDENT_AMBULATORY_CARE_PROVIDER_SITE_OTHER): Payer: Medicare Other | Admitting: Family Medicine

## 2016-06-18 VITALS — BP 111/73 | HR 86 | Temp 98.2°F | Ht 65.0 in | Wt 173.0 lb

## 2016-06-18 DIAGNOSIS — R3589 Other polyuria: Secondary | ICD-10-CM

## 2016-06-18 DIAGNOSIS — F339 Major depressive disorder, recurrent, unspecified: Secondary | ICD-10-CM

## 2016-06-18 DIAGNOSIS — I10 Essential (primary) hypertension: Secondary | ICD-10-CM | POA: Diagnosis not present

## 2016-06-18 DIAGNOSIS — F980 Enuresis not due to a substance or known physiological condition: Secondary | ICD-10-CM

## 2016-06-18 DIAGNOSIS — R358 Other polyuria: Secondary | ICD-10-CM | POA: Diagnosis not present

## 2016-06-18 DIAGNOSIS — H109 Unspecified conjunctivitis: Secondary | ICD-10-CM

## 2016-06-18 DIAGNOSIS — Z113 Encounter for screening for infections with a predominantly sexual mode of transmission: Secondary | ICD-10-CM

## 2016-06-18 DIAGNOSIS — K5900 Constipation, unspecified: Secondary | ICD-10-CM | POA: Diagnosis not present

## 2016-06-18 LAB — CBC WITH DIFFERENTIAL/PLATELET
BASOS ABS: 0 {cells}/uL (ref 0–200)
BASOS PCT: 0 %
EOS ABS: 240 {cells}/uL (ref 15–500)
Eosinophils Relative: 5 %
HEMATOCRIT: 41.4 % (ref 38.5–50.0)
Hemoglobin: 13.9 g/dL (ref 13.2–17.1)
Lymphocytes Relative: 37 %
Lymphs Abs: 1776 cells/uL (ref 850–3900)
MCH: 30.5 pg (ref 27.0–33.0)
MCHC: 33.6 g/dL (ref 32.0–36.0)
MCV: 90.8 fL (ref 80.0–100.0)
MONO ABS: 432 {cells}/uL (ref 200–950)
MONOS PCT: 9 %
MPV: 10.3 fL (ref 7.5–12.5)
NEUTROS PCT: 49 %
Neutro Abs: 2352 cells/uL (ref 1500–7800)
PLATELETS: 220 10*3/uL (ref 140–400)
RBC: 4.56 MIL/uL (ref 4.20–5.80)
RDW: 13 % (ref 11.0–15.0)
WBC: 4.8 10*3/uL (ref 3.8–10.8)

## 2016-06-18 LAB — POCT GLYCOSYLATED HEMOGLOBIN (HGB A1C): Hemoglobin A1C: 5.6

## 2016-06-18 LAB — HIV ANTIBODY (ROUTINE TESTING W REFLEX): HIV 1&2 Ab, 4th Generation: NONREACTIVE

## 2016-06-18 MED ORDER — OXYBUTYNIN CHLORIDE 5 MG PO TABS: 10.0000 mg | ORAL_TABLET | Freq: Every day | ORAL | Status: DC

## 2016-06-18 MED ORDER — POLYETHYLENE GLYCOL 3350 17 GM/SCOOP PO POWD: 17.0000 g | Freq: Every day | ORAL | Status: DC | PRN

## 2016-06-18 MED ORDER — POLYMYXIN B-TRIMETHOPRIM 10000-0.1 UNIT/ML-% OP SOLN: 1.0000 [drp] | OPHTHALMIC | Status: DC

## 2016-06-18 MED ORDER — HYDROCHLOROTHIAZIDE 12.5 MG PO CAPS: 12.5000 mg | ORAL_CAPSULE | Freq: Every day | ORAL | Status: DC

## 2016-06-18 NOTE — Assessment & Plan Note (Signed)
Ongoing issue, well controlled with ditropan prn, some concern for dm given also on risperdal - refill ditropan - check A1c

## 2016-06-18 NOTE — Progress Notes (Signed)
   Subjective:   Nicholas Harding is a 30 y.o. male with a history of htn, autism, depression here for f/u htn  CHRONIC HYPERTENSION  Disease Monitoring  Blood pressure range: 110-130/70-80s  Chest pain: no   Dyspnea: no   Claudication: no   Medication compliance: yes  Medication Side Effects  Lightheadedness: no   Urinary frequency: yes, this is a chronic issue and not new with meds  Edema: no    Preventitive Healthcare:  Exercise: no   Diet Pattern: tid  Salt Restriction: yes  Mom also reports his eye has been pink recently, she says he gets pink eye often and takes polytrim which works well. He has not had any fever.  Review of Systems:  Per HPI. All other systems reviewed and are negative.   PMH, PSH, Medications, Allergies, and FmHx reviewed and updated in EMR.  Social History: never smoker  Objective:  BP 111/73 mmHg  Pulse 86  Temp(Src) 98.2 F (36.8 C) (Oral)  Ht 5\' 5"  (1.651 m)  Wt 173 lb (78.472 kg)  BMI 28.79 kg/m2  SpO2 98%  Gen:  30 y.o. male in NAD HEENT: NCAT, MMM, EOMI, PERRL, anicteric sclerae, right eye with mild conjunctival injection and crusting CV: RRR, no MRG Resp: Non-labored, CTAB, no wheezes noted Abd: Soft, NTND, BS present, no guarding or organomegaly Ext: WWP, no edema MSK: Full ROM, strength intact Neuro: Alert and oriented, speech normal      Chemistry      Component Value Date/Time   NA 140 06/20/2015 0953   K 4.0 06/20/2015 0953   CL 103 06/20/2015 0953   CO2 24 06/20/2015 0953   BUN 11 06/20/2015 0953   CREATININE 1.05 06/20/2015 0953   CREATININE 1.00 04/18/2010 1932      Component Value Date/Time   CALCIUM 9.0 06/20/2015 0953   ALKPHOS 59 06/20/2015 0953   AST 19 06/20/2015 0953   ALT <8 06/20/2015 0953   BILITOT 0.4 06/20/2015 0953      Lab Results  Component Value Date   WBC 5.1 06/20/2015   HGB 14.5 06/20/2015   HCT 42.6 06/20/2015   MCV 89.7 06/20/2015   PLT 232 06/20/2015   No results found for:  TSH No results found for: HGBA1C Assessment & Plan:     Nicholas Harding is a 30 y.o. male here for f/u htn  HYPERTENSION, BENIGN SYSTEMIC Well controlled, no medication side effects or symptoms - continue hctz - check CMP, A1c, lipids - f/u in 1 year, sooner if BP change  ENURESIS Ongoing issue, well controlled with ditropan prn, some concern for dm given also on risperdal - refill ditropan - check A1c  Major depressive disorder, recurrent episode Followed by psych, stable on meds, no changes or concerns per mom - check CBC       Nicholas LowElena Jenavie Stanczak, MD, MPH [facility] PGY-3 06/18/2016 11:10 AM

## 2016-06-18 NOTE — Assessment & Plan Note (Addendum)
Followed by psych, stable on meds, no changes or concerns per mom - check CBC

## 2016-06-18 NOTE — Assessment & Plan Note (Addendum)
Well controlled, no medication side effects or symptoms - continue hctz - check CMP, A1c, lipids - f/u in 1 year, sooner if BP change

## 2016-06-18 NOTE — Patient Instructions (Signed)

## 2016-06-19 LAB — LIPID PANEL
CHOL/HDL RATIO: 2.6 ratio (ref <=5.0)
Cholesterol: 127 mg/dL (ref 125–200)
HDL: 49 mg/dL (ref >=40)
LDL CALC: 69 mg/dL (ref <130)
TRIGLYCERIDES: 46 mg/dL (ref <150)
VLDL: 9 mg/dL (ref <30)

## 2016-06-19 LAB — COMPREHENSIVE METABOLIC PANEL
ALT: 7 U/L — ABNORMAL LOW (ref 9–46)
AST: 17 U/L (ref 10–40)
Albumin: 3.9 g/dL (ref 3.6–5.1)
Alkaline Phosphatase: 56 U/L (ref 40–115)
BUN: 10 mg/dL (ref 7–25)
CHLORIDE: 105 mmol/L (ref 98–110)
CO2: 27 mmol/L (ref 20–31)
CREATININE: 1.04 mg/dL (ref 0.60–1.35)
Calcium: 9 mg/dL (ref 8.6–10.3)
Glucose, Bld: 87 mg/dL (ref 65–99)
POTASSIUM: 3.7 mmol/L (ref 3.5–5.3)
SODIUM: 140 mmol/L (ref 135–146)
TOTAL PROTEIN: 6.7 g/dL (ref 6.1–8.1)
Total Bilirubin: 0.4 mg/dL (ref 0.2–1.2)

## 2016-06-19 NOTE — Progress Notes (Signed)
Quick Note:  Please inform mom, all results normal. Will also fax result to his center as she requested. Thanks! ______

## 2017-04-23 ENCOUNTER — Other Ambulatory Visit: Payer: Self-pay | Admitting: Internal Medicine

## 2017-04-23 DIAGNOSIS — I10 Essential (primary) hypertension: Secondary | ICD-10-CM

## 2017-04-23 MED ORDER — HYDROCHLOROTHIAZIDE 12.5 MG PO CAPS: 12.5000 mg | ORAL_CAPSULE | Freq: Every day | ORAL | 2 refills | Status: DC

## 2017-04-23 NOTE — Telephone Encounter (Signed)
Pt needs a refill on hydrochlorothiazide. Pt is out and Mom wants to get this refilled today. Pt uses [facility] [address] Veterinary surgeon. ep

## 2017-05-22 ENCOUNTER — Other Ambulatory Visit: Payer: Self-pay | Admitting: *Deleted

## 2017-05-22 DIAGNOSIS — F980 Enuresis not due to a substance or known physiological condition: Secondary | ICD-10-CM

## 2017-05-22 MED ORDER — OXYBUTYNIN CHLORIDE 5 MG PO TABS
10.0000 mg | ORAL_TABLET | Freq: Every day | ORAL | 3 refills | Status: DC
Start: 2017-05-22 — End: 2017-06-26

## 2017-06-26 ENCOUNTER — Ambulatory Visit (INDEPENDENT_AMBULATORY_CARE_PROVIDER_SITE_OTHER): Payer: Medicare Other | Admitting: Internal Medicine

## 2017-06-26 VITALS — BP 104/70 | HR 66 | Temp 98.5°F | Ht 65.0 in | Wt 187.0 lb

## 2017-06-26 DIAGNOSIS — H5789 Other specified disorders of eye and adnexa: Secondary | ICD-10-CM

## 2017-06-26 DIAGNOSIS — Z0000 Encounter for general adult medical examination without abnormal findings: Secondary | ICD-10-CM | POA: Diagnosis present

## 2017-06-26 DIAGNOSIS — F980 Enuresis not due to a substance or known physiological condition: Secondary | ICD-10-CM

## 2017-06-26 DIAGNOSIS — Z5181 Encounter for therapeutic drug level monitoring: Secondary | ICD-10-CM | POA: Diagnosis not present

## 2017-06-26 DIAGNOSIS — I10 Essential (primary) hypertension: Secondary | ICD-10-CM

## 2017-06-26 DIAGNOSIS — K5900 Constipation, unspecified: Secondary | ICD-10-CM

## 2017-06-26 DIAGNOSIS — H578 Other specified disorders of eye and adnexa: Secondary | ICD-10-CM

## 2017-06-26 DIAGNOSIS — E669 Obesity, unspecified: Secondary | ICD-10-CM | POA: Diagnosis not present

## 2017-06-26 DIAGNOSIS — R7301 Impaired fasting glucose: Secondary | ICD-10-CM | POA: Diagnosis not present

## 2017-06-26 LAB — POCT GLYCOSYLATED HEMOGLOBIN (HGB A1C): Hemoglobin A1C: 5.7

## 2017-06-26 MED ORDER — HYDROCHLOROTHIAZIDE 12.5 MG PO CAPS: 12.5000 mg | ORAL_CAPSULE | Freq: Every day | ORAL | 3 refills | Status: DC

## 2017-06-26 MED ORDER — KETOTIFEN FUMARATE 0.025 % OP SOLN: OPHTHALMIC | 4 refills | Status: DC

## 2017-06-26 MED ORDER — POLYETHYLENE GLYCOL 3350 17 GM/SCOOP PO POWD: ORAL | 11 refills | Status: DC

## 2017-06-26 MED ORDER — OXYBUTYNIN CHLORIDE 5 MG PO TABS: 10.0000 mg | ORAL_TABLET | Freq: Every day | ORAL | 11 refills | Status: DC

## 2017-06-26 NOTE — Progress Notes (Signed)
[facility] Progress Note  Subjective:  Nicholas Harding is a 31 y.o. male with history of HTN, constipation, autism, enuresis, and cognitive impairment who presents for check-up.  Mother accompanies patient to appointment today. He is non-verbal. Mother has no concerns but requests paper prescriptions for HCTZ 12.5 mg, polyethylene glycol, and oxybutynin.  Past Medical History:  Diagnosis Date  . Allergy   . Autism   . Constipation   . Depression   . Hypertension   . Mental retardation   . Nonorganic enuresis    Eye drainage:  - Not an issue unless allergies acting up, per mother  Depression: - Follows with Psychiatry - On prozac, risperidone, and cogentin  Constipation: - Well controlled on daily miralax, per mother  Obesity: - Drinks about 2 cups of juice a day - Likes brownie bite snacks and cookies - Does like fruits and vegetables but has more fruits than vegetables  HTN: - On HCTZ 12.5 mg and clonidine (for sleep)  Social: - Lives with mother but goes to adult day center during the day; has been going there for 9 years - Walks a lot and plays basketball for exercise - Does not drink EtOH, use tobacco or do drugs  Complete ROS completed with mother and negative.   No Known Allergies  Objective: Blood pressure 104/70, pulse 66, temperature 98.5 F (36.9 C), temperature source Oral, height 5\' 5"  (1.651 m), weight 187 lb (84.8 kg), SpO2 99 %. Body mass index is 31.12 kg/m. Constitutional: Obese male, pulling at his shirt Eyes: Mild conjunctivitis bilaterally with drainage of L eye.  HENT: MMM, mild swelling and erythema of nasal turbinates Cardiovascular: RRR, S1, S2, no m/r/g.  Pulmonary/Chest: Effort normal and breath sounds normal. No respiratory distress.  Abdominal: Soft. +BS, NT, ND  Musculoskeletal: No LE edema Neurological: Normal gait. Nonverbal.  Skin: Skin is warm and dry. No rash noted.  Psychiatric: Somewhat anxious.  Vitals  reviewed  Recent Results (from the past 2160 hour(s))  POCT HgB A1C (CPT 83036)     Status: None   Collection Time: 06/26/17  3:24 PM  Result Value Ref Range   Hemoglobin A1C 5.7   Basic Metabolic Panel     Status: Abnormal   Collection Time: 06/26/17  3:25 PM  Result Value Ref Range   Glucose 67 65 - 99 mg/dL   BUN 12 6 - 20 mg/dL   Creatinine, Ser 1.61 0.76 - 1.27 mg/dL   GFR calc non Af Amer 80 >59 mL/min/1.73   GFR calc Af Amer 93 >59 mL/min/1.73   BUN/Creatinine Ratio 10 9 - 20   Sodium 145 (H) 134 - 144 mmol/L   Potassium 3.9 3.5 - 5.2 mmol/L   Chloride 106 96 - 106 mmol/L   CO2 25 20 - 29 mmol/L    Comment:               **Please note reference interval change**   Calcium 9.3 8.7 - 10.2 mg/dL   Assessment/Plan: HYPERTENSION, BENIGN SYSTEMIC - BP on lower side today - Recommended trial off of HCTZ, as patient to continue clonidine at bedtime for sleep - Mother to have patient's BP checked at day center. Gave goal of < 140/90.   Medication monitoring encounter - Patient on atypical antipsychotic. Lipid panel normal last year. Consider rechecking next year. Hgb A1c now in prediabetes range but only slightly up from last year at 5.7 vs. 5.6. Suspect has more to do with diet than medication.  -  Mother asks that lab results be [facility]ed to [facility] c/o Brock BadLinda Greninger at fax#[phone]  Obesity (BMI 30-39.9) - Advised mom to give less juice and sweet snacks  Eye drainage - Chronic. Suspect allergic. - Recommend zaditor prn.   Follow-up in 6 months to recheck weight and hgb A1c.  Dani GobbleHillary Tushar Enns, MD [facility], PGY-3

## 2017-06-26 NOTE — Patient Instructions (Addendum)
It was nice to see you today, Mr. Roll.  Your blood pressure looks good.  I recommend decreasing juice to once a day and trying to have more vegetables to help with weight.  Best, Dr. Sampson GoonFitzgerald

## 2017-06-27 DIAGNOSIS — E669 Obesity, unspecified: Secondary | ICD-10-CM | POA: Insufficient documentation

## 2017-06-27 DIAGNOSIS — H5789 Other specified disorders of eye and adnexa: Secondary | ICD-10-CM | POA: Insufficient documentation

## 2017-06-27 DIAGNOSIS — Z00129 Encounter for routine child health examination without abnormal findings: Secondary | ICD-10-CM | POA: Insufficient documentation

## 2017-06-27 LAB — BASIC METABOLIC PANEL
BUN / CREAT RATIO: 10 (ref 9–20)
BUN: 12 mg/dL (ref 6–20)
CO2: 25 mmol/L (ref 20–29)
Calcium: 9.3 mg/dL (ref 8.7–10.2)
Chloride: 106 mmol/L (ref 96–106)
Creatinine, Ser: 1.19 mg/dL (ref 0.76–1.27)
GFR, EST AFRICAN AMERICAN: 93 mL/min/{1.73_m2} (ref >59)
GFR, EST NON AFRICAN AMERICAN: 80 mL/min/{1.73_m2} (ref >59)
Glucose: 67 mg/dL (ref 65–99)
POTASSIUM: 3.9 mmol/L (ref 3.5–5.2)
SODIUM: 145 mmol/L — AB (ref 134–144)

## 2017-06-27 NOTE — Assessment & Plan Note (Signed)
-   Patient on atypical antipsychotic. Lipid panel normal last year. Consider rechecking next year. Hgb A1c now in prediabetes range but only slightly up from last year at 5.7 vs. 5.6. Suspect has more to do with diet than medication.  - Mother asks that lab results be faxed to [facility]/o Brock BadLinda Greninger at fax#[phone]

## 2017-06-27 NOTE — Assessment & Plan Note (Signed)
-   Advised mom to give less juice and sweet snacks

## 2017-06-27 NOTE — Assessment & Plan Note (Signed)
-   Chronic. Suspect allergic. - Recommend zaditor prn.

## 2017-06-27 NOTE — Assessment & Plan Note (Signed)
-   BP on lower side today - Recommended trial off of HCTZ, as patient to continue clonidine at bedtime for sleep - Mother to have patient's BP checked at day center. Gave goal of < 140/90.

## 2017-08-28 ENCOUNTER — Other Ambulatory Visit: Payer: Self-pay | Admitting: Family Medicine

## 2017-08-28 DIAGNOSIS — K5900 Constipation, unspecified: Secondary | ICD-10-CM

## 2017-08-28 MED ORDER — POLYETHYLENE GLYCOL 3350 17 GM/SCOOP PO POWD: ORAL | 11 refills | Status: DC

## 2018-01-14 ENCOUNTER — Telehealth: Payer: Self-pay | Admitting: Internal Medicine

## 2018-01-14 ENCOUNTER — Emergency Department (HOSPITAL_COMMUNITY): Payer: Medicare Other

## 2018-01-14 ENCOUNTER — Ambulatory Visit (INDEPENDENT_AMBULATORY_CARE_PROVIDER_SITE_OTHER): Payer: Medicare Other | Admitting: Family Medicine

## 2018-01-14 ENCOUNTER — Emergency Department (HOSPITAL_COMMUNITY)
Admission: EM | Admit: 2018-01-14 | Discharge: 2018-01-14 | Disposition: A | Discharge status: Home / Self Care | Payer: Medicare Other | Attending: Emergency Medicine | Admitting: Emergency Medicine

## 2018-01-14 ENCOUNTER — Other Ambulatory Visit: Payer: Self-pay

## 2018-01-14 ENCOUNTER — Encounter (HOSPITAL_COMMUNITY): Payer: Self-pay | Admitting: Emergency Medicine

## 2018-01-14 VITALS — BP 116/70 | HR 101 | Temp 103.2°F | Ht 66.0 in | Wt 179.8 lb

## 2018-01-14 DIAGNOSIS — Z79899 Other long term (current) drug therapy: Secondary | ICD-10-CM | POA: Diagnosis not present

## 2018-01-14 DIAGNOSIS — I10 Essential (primary) hypertension: Secondary | ICD-10-CM | POA: Insufficient documentation

## 2018-01-14 DIAGNOSIS — R509 Fever, unspecified: Secondary | ICD-10-CM | POA: Diagnosis not present

## 2018-01-14 DIAGNOSIS — F79 Unspecified intellectual disabilities: Secondary | ICD-10-CM | POA: Diagnosis not present

## 2018-01-14 DIAGNOSIS — F840 Autistic disorder: Secondary | ICD-10-CM | POA: Diagnosis not present

## 2018-01-14 DIAGNOSIS — R5081 Fever presenting with conditions classified elsewhere: Secondary | ICD-10-CM

## 2018-01-14 LAB — URINALYSIS, ROUTINE W REFLEX MICROSCOPIC
Glucose, UA: NEGATIVE mg/dL
Hgb urine dipstick: NEGATIVE
Ketones, ur: 5 mg/dL — AB
LEUKOCYTES UA: NEGATIVE
NITRITE: NEGATIVE
PH: 5 (ref 5.0–8.0)
Protein, ur: >=300 mg/dL — AB
SPECIFIC GRAVITY, URINE: 1.034 — AB (ref 1.005–1.030)

## 2018-01-14 LAB — COMPREHENSIVE METABOLIC PANEL
ALT: 18 U/L (ref 17–63)
AST: 28 U/L (ref 15–41)
Albumin: 3 g/dL — ABNORMAL LOW (ref 3.5–5.0)
Alkaline Phosphatase: 67 U/L (ref 38–126)
Anion gap: 10 (ref 5–15)
BILIRUBIN TOTAL: 0.9 mg/dL (ref 0.3–1.2)
BUN: 7 mg/dL (ref 6–20)
CO2: 23 mmol/L (ref 22–32)
Calcium: 8.7 mg/dL — ABNORMAL LOW (ref 8.9–10.3)
Chloride: 105 mmol/L (ref 101–111)
Creatinine, Ser: 1.09 mg/dL (ref 0.61–1.24)
Glucose, Bld: 134 mg/dL — ABNORMAL HIGH (ref 65–99)
POTASSIUM: 3.4 mmol/L — AB (ref 3.5–5.1)
Sodium: 138 mmol/L (ref 135–145)
TOTAL PROTEIN: 7.1 g/dL (ref 6.5–8.1)

## 2018-01-14 LAB — CBC WITH DIFFERENTIAL/PLATELET
BASOS ABS: 0 10*3/uL (ref 0.0–0.1)
BASOS PCT: 0 %
EOS PCT: 0 %
Eosinophils Absolute: 0 10*3/uL (ref 0.0–0.7)
HCT: 39.4 % (ref 39.0–52.0)
Hemoglobin: 13.2 g/dL (ref 13.0–17.0)
Lymphocytes Relative: 10 %
Lymphs Abs: 1.4 10*3/uL (ref 0.7–4.0)
MCH: 30.7 pg (ref 26.0–34.0)
MCHC: 33.5 g/dL (ref 30.0–36.0)
MCV: 91.6 fL (ref 78.0–100.0)
MONO ABS: 2 10*3/uL — AB (ref 0.1–1.0)
Monocytes Relative: 14 %
Neutro Abs: 11.1 10*3/uL — ABNORMAL HIGH (ref 1.7–7.7)
Neutrophils Relative %: 76 %
PLATELETS: 194 10*3/uL (ref 150–400)
RBC: 4.3 MIL/uL (ref 4.22–5.81)
RDW: 12.4 % (ref 11.5–15.5)
WBC: 14.6 10*3/uL — ABNORMAL HIGH (ref 4.0–10.5)

## 2018-01-14 LAB — I-STAT CG4 LACTIC ACID, ED
Lactic Acid, Venous: 0.68 mmol/L (ref 0.5–1.9)
Lactic Acid, Venous: 1.57 mmol/L (ref 0.5–1.9)

## 2018-01-14 LAB — INFLUENZA PANEL BY PCR (TYPE A & B)
INFLBPCR: NEGATIVE
Influenza A By PCR: NEGATIVE

## 2018-01-14 MED ORDER — ACETAMINOPHEN 325 MG PO TABS
650.0000 mg | ORAL_TABLET | Freq: Once | ORAL | Status: AC
  Administered 2018-01-14: 650 mg via ORAL
  Filled 2018-01-14: qty 2

## 2018-01-14 MED ORDER — SODIUM CHLORIDE 0.9 % IV BOLUS (SEPSIS)
1000.0000 mL | Freq: Once | INTRAVENOUS | Status: AC
  Administered 2018-01-14: 1000 mL via INTRAVENOUS

## 2018-01-14 NOTE — Patient Instructions (Signed)
Please go to the emergency room for evaluation. 

## 2018-01-14 NOTE — ED Triage Notes (Signed)
Pt to ER accompanied by caretaker, states sent from family practice due to fever of 103. Denies n/v, denies cough. Pt appears lethargic.

## 2018-01-14 NOTE — Assessment & Plan Note (Signed)
Fever without a source.  Worsening over the last 3 days.  Does not have viral URI symptoms that mother has noticed and unable to focalize if he has any pain. Precepted with Dr. Gwendolyn GrantWalden who recommended patient by sent to ER for evaluation. Patient wheeled over with mother accompanying.

## 2018-01-14 NOTE — Discharge Instructions (Signed)
Tylenol every 4 hours for fever.  Recheck at family practice Friday at 8:45am.  Return if any problems.

## 2018-01-14 NOTE — Progress Notes (Signed)
    Subjective:  Nicholas Harding is a 32 y.o. male who [facility] with a chief complaint of fever.  Mother is historian as patient has autism and is nonverbal.   HPI:  Presents with 3 days of fever.  Mother states he has not been eating not drinking and not acting like himself.  Has been progressively worsening.   Came back from center today from which he is usually tired, however today is especially lethargic and sleepy.   Last had NyQuil last night.   No stuffy nose/runny nose. Does have a very minimal occasional cough. No wheezing.   At baseline is not able to verbalize if he has pain.  Usually he becomes sleepy when he is in pain or has any sort of illness. Has red eyes chronically for which mother gives Clear Eyes. No vomiting. No urinary urgency or frequency.  ROS: Per HPI  Objective:  Physical Exam: BP 116/70   Pulse (!) 101   Temp (!) 103.2 F (39.6 C) (Oral)   Ht 5\' 6"  (1.676 m)   Wt 179 lb 12.8 oz (81.6 kg)   SpO2 97%   BMI 29.02 kg/m   Gen: lethargic, flushed and sick appearing HEENT: Bilateral ear canals occluded with wax.  No neck stiffness or tenderness on palpation.  Conjunctiva erythematous. PERRL. Dry lip but moist mucous membranes.  Oropharynx not visualized CV: RRR with no murmurs appreciated Pulm: NWOB, CTAB with no crackles, wheezes, or rhonchi GI: Normal bowel sounds present. Soft, Nontender, Nondistended. MSK: no edema, cyanosis, or clubbing noted Skin: Very warm to touch.  Dry.  No rashes on extremities Neuro: Lethargic but responsive to voice and touch   Assessment/Plan:  Fever Fever without a source.  Worsening over the last 3 days.  Does not have viral URI symptoms that mother has noticed and unable to focalize if he has any pain. Precepted with Dr. Gwendolyn GrantWalden who recommended patient by sent to ER for evaluation. Patient wheeled over with mother accompanying.   Nicholas HerElsia J [name], DO PGY-2, [facility] 01/14/2018 3:53 PM

## 2018-01-14 NOTE — Consult Note (Signed)
Family Medicine Teaching [facility] Consult Note Service Pager: [phone]  Patient name: Nicholas Harding Medical record number: [id number] Date of birth: 1986/05/12 Age: 32 y.o. Gender: male  Primary Care Provider: Casey Burkitt, MD  Chief Complaint: fever  Assessment and Plan: Nicholas Harding is a 32 y.o. male presenting with fever. PMH is significant for autism, HTN.  Fever: Temp 103F upon ED presentation. Decreased to 100.724F after one dose PO Tylenol 650mg . Lactic acid WNL x2. WBC elevated to 14, however CBC otherwise unremarkable. UA without signs of infection. CXR with no acute process. CMP unremarkable. Flu panel negative. Patient received IVF bolus but is tolerating PO. Fever most likely of viral origin. Antibiotics not indicated at this time. No medical indication for admission.  - Discharge home with strict return precautions - Appt scheduled for patient at [facility] on 01/25 at 0845 (offered appt tomorrow AM; mother refused) - Provided cab voucher for mother and patient - Advised against continued Nyquil use - Can use Tylenol PRN  Disposition: Home  History of Present Illness:  Nicholas Harding is a 32 y.o. male presenting with fever.   Began 3d ago. Mother also notes have been sleepier than usual. Has been giving him Nyquil with no improvement in symptoms. Went to [facility] earlier today and had temp 103.24F with no apparent source. Was sent to ED for further work-up. In ED, labs were unremarkable except WBC 14, and CXR WNL. Mother initially stating that she was not comfortable with patient being discharged, so FPTS consulted for admission.  Examined patient and spoke with mother regarding his status. Explained that, as no source of fever can be found, it is likely of viral etiology, and antibiotics are not indicated. Also explained that since patient is able to drink fluids and take medications by mouth, he does not need to continue to receive IVF. As such, explained to mother that  there is no medical indication for him to remain inpatient, and can instead go home tonight and be seen for follow-up in clinic tomorrow. Mother initially amenable to this plan, however said she did not feel like he was sick enough to need to be seen in clinic tomorrow, as she thought that would be too soon. Explained to mother that follow up was important, however if he continues to do well, a follow up on Friday morning would be acceptable as well. Mother expressed displeasure at having to drive to [facility] twice in one week but said she would do so.  Mother then became concerned about driving her car after dark tonight. Offered mother cab voucher to take her and patient home so she would not have to drive. Mother then stating that she cannot leave because then her car would be here unattended. Explained that her car is not any less safe in the parking lot with her inside the hospital vs at her house, and that she could pick it up at her convenience the next day. Mother then stating that she wants her son to be discharged immediately, and that she will be calling her lawyer tonight.  Obtained cab voucher to be given to mother and informed her of such. Informed mother that patient has appt scheduled at [facility] for f/u on 01/25 at 0845 AM.   Review Of Systems: Per HPI with the following additions:   Review of Systems  Constitutional: Positive for fever.  HENT: Negative for congestion.   Respiratory: Positive for cough (Occasional). Negative for shortness of breath.   Gastrointestinal: Negative for diarrhea  and vomiting.    Patient Active Problem List   Diagnosis Date Noted  . Fever 01/14/2018  . Medication monitoring encounter 06/27/2017  . Obesity (BMI 30-39.9) 06/27/2017  . Eye drainage 06/27/2017  . 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 Routine history and physical examination of adult 05/20/2012  . Thickening, skin 01/29/2012  . Constipation 05/01/2011  . ENURESIS 03/08/2010  . Active autistic  disorder 04/05/2008  . Major depressive disorder, recurrent episode (HCC) 02/19/2007  . 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 HYPERTENSION, BENIGN SYSTEMIC 02/19/2007    Past Medical History: Past Medical History:  Diagnosis Date  . Allergy   . Autism   . Constipation   . Depression   . Hypertension   . Mental retardation   . Nonorganic enuresis     Past Surgical History: History reviewed. No pertinent surgical history.  Social History: Social History   Tobacco Use  . Smoking status: Never Smoker  . Smokeless tobacco: Never Used  Substance Use Topics  . Alcohol use: No  . Drug use: No   Additional social history: Non-verbal. Cared for by mother.   Please also refer to relevant sections of EMR.  Family History: History reviewed. No pertinent family history.  Allergies and Medications: No Known Allergies No current facility-administered medications on file prior to encounter.    Current Outpatient Medications on File Prior to Encounter  Medication Sig Dispense Refill  . benztropine (COGENTIN) 0.5 MG tablet     . cloNIDine (CATAPRES) 0.1 MG tablet Per [facility]    . FLUoxetine (PROZAC) 20 MG capsule Per [facility]    . ketotifen (ZADITOR) 0.025 % ophthalmic solution 2 times daily as needed for eye drainage 5 mL 4  . LORazepam (ATIVAN) 1 MG tablet take 1 tablet by mouth every morning 1 tablet every evening if needed  0  . oxybutynin (DITROPAN) 5 MG tablet Take 2 tablets (10 mg total) by mouth at bedtime. 60 tablet 11  . polyethylene glycol powder (GLYCOLAX/MIRALAX) powder Give 0.5-1 capful as needed for regular, soft BM daily. 578 g 11  . risperiDONE (RISPERDAL) 0.5 MG tablet Per [facility]    . trimethoprim-polymyxin b (POLYTRIM) ophthalmic solution Place 1 drop into the right eye every 4 (four) hours. 10 mL 0  . urea (CARMOL) 40 % CREA Apply 1 application topically daily. 1 each 6  . [DISCONTINUED] oxybutynin (DITROPAN) 5 MG tablet take 2 tablets by  mouth BEFORE BED 30 tablet 12    Objective: BP 111/69   Pulse 81   Temp (!) 100.5 F (38.1 C) (Oral)   Resp (!) 21   SpO2 100%  Exam: General: lying in bed sleeping peacefully Eyes: PERRLA ENTM: MMM Neck: supple Cardiovascular: RRR, no murmurs appreciated Respiratory: CTAB, normal WOB on RA Gastrointestinal: soft, NTND, +BS MSK: moving all extremities spontaneously Derm: warm and dry Neuro: alert  Labs and Imaging: CBC BMET  Recent Labs  Lab 01/14/18 1703  WBC 14.6*  HGB 13.2  HCT 39.4  PLT 194   Recent Labs  Lab 01/14/18 1703  NA 138  K 3.4*  CL 105  CO2 23  BUN 7  CREATININE 1.09  GLUCOSE 134*  CALCIUM 8.7*     Dg Chest 2 View  Result Date: 01/14/2018 CLINICAL DATA:  Fever for the past 3 days. No chest pain or shortness of breath. EXAM: CHEST  2 VIEW COMPARISON:  12/15/2007 FINDINGS: Unchanged cardiac silhouette and mediastinal contours. No focal parenchymal opacities. No pleural effusion or  pneumothorax. No evidence of edema. No acute osseous abnormalities. Moderate to severe scoliotic curvature of the thoracolumbar spine, unchanged. IMPRESSION: 1. No acute cardiopulmonary disease. Specifically, no evidence of pneumonia. 2. Moderate to severe scoliotic curvature of the thoracolumbar spine, grossly unchanged. Electronically Signed   By: Simonne Come M.D.   On: 01/14/2018 17:56     Marquette Saa, MD 01/14/2018, 9:50 PM PGY-3, [facility] FPTS Intern pager: [phone], text pages welcome

## 2018-01-14 NOTE — ED Provider Notes (Signed)
[facility] EMERGENCY DEPARTMENT Provider Note   CSN: [id number] Arrival date & time: 01/14/18  1628     History   Chief Complaint Chief Complaint  Patient presents with  . Fever    HPI Nicholas Harding is a 33 y.o. male.  The history is provided by a parent. No language interpreter was used.  Fever   This is a new problem. The current episode started 6 to 12 hours ago. The problem occurs constantly. The problem has been gradually worsening. The maximum temperature noted was 103 to 104 F.  Pt sent from family practice clinic for evaluation of elevated temperature.  Pt has a history of autism.  Mother provides history She reports pt is drinking less.  Pt less active than normal  Past Medical History:  Diagnosis Date  . Allergy   . Autism   . Constipation   . Depression   . Hypertension   . Mental retardation   . Nonorganic enuresis     Patient Active Problem List   Diagnosis Date Noted  . Fever 01/14/2018  . Medication monitoring encounter 06/27/2017  . Obesity (BMI 30-39.9) 06/27/2017  . Eye drainage 06/27/2017  . 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 Routine history and physical examination of adult 05/20/2012  . Thickening, skin 01/29/2012  . Constipation 05/01/2011  . ENURESIS 03/08/2010  . Active autistic disorder 04/05/2008  . Major depressive disorder, recurrent episode (HCC) 02/19/2007  . 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 HYPERTENSION, BENIGN SYSTEMIC 02/19/2007    History reviewed. No pertinent surgical history.     Home Medications    Prior to Admission medications   Medication Sig Start Date End Date Taking? Authorizing Provider  benztropine (COGENTIN) 0.5 MG tablet  05/23/16   [provider]  cloNIDine (CATAPRES) 0.1 MG tablet Per [facility]    [provider]  FLUoxetine (PROZAC) 20 MG capsule Per [facility]    [provider]  ketotifen (ZADITOR) 0.025 % ophthalmic solution 2 times daily  as needed for eye drainage 06/26/17   Casey Burkitt, MD  LORazepam (ATIVAN) 1 MG tablet take 1 tablet by mouth every morning 1 tablet every evening if needed 05/23/16   [provider]  oxybutynin (DITROPAN) 5 MG tablet Take 2 tablets (10 mg total) by mouth at bedtime. 06/26/17   Casey Burkitt, MD  polyethylene glycol powder [facility]) powder Give 0.5-1 capful as needed for regular, soft BM daily. 08/28/17   Carney Living, MD  risperiDONE (RISPERDAL) 0.5 MG tablet Per [facility]    [provider]  trimethoprim-polymyxin b (POLYTRIM) ophthalmic solution Place 1 drop into the right eye every 4 (four) hours. 06/18/16   Abram Sander, MD  urea (CARMOL) 40 % CREA Apply 1 application topically daily. 06/14/15   Abram Sander, MD    Family History History reviewed. No pertinent family history.  Social History Social History   Tobacco Use  . Smoking status: Never Smoker  . Smokeless tobacco: Never Used  Substance Use Topics  . Alcohol use: No  . Drug use: No     Allergies   Patient has no known allergies.   Review of Systems Review of Systems  Constitutional: Positive for fever.  All other systems reviewed and are negative.    Physical Exam Updated Vital Signs BP 112/78   Pulse 93   Temp (!) 100.5 F (38.1 C) (Oral)   Resp 20   SpO2 93%   Physical Exam  Constitutional: He appears well-developed.  HENT:  Head: Normocephalic.  Mouth/Throat: Oropharynx is clear and moist.  Eyes: Pupils are equal, round, and reactive to light.  Cardiovascular:  tachycardia  Pulmonary/Chest: Effort normal.  Abdominal: Soft.  Neurological: He is alert.  Skin: Skin is warm.     ED Treatments / Results  Labs (all labs ordered are listed, but only abnormal results are displayed) Labs Reviewed  COMPREHENSIVE METABOLIC PANEL - Abnormal; Notable for the following components:      Result Value   Potassium 3.4 (*)    Glucose, Bld 134  (*)    Calcium 8.7 (*)    Albumin 3.0 (*)    All other components within normal limits  CBC WITH DIFFERENTIAL/PLATELET - Abnormal; Notable for the following components:   WBC 14.6 (*)    Neutro Abs 11.1 (*)    Monocytes Absolute 2.0 (*)    All other components within normal limits  URINALYSIS, ROUTINE W REFLEX MICROSCOPIC  INFLUENZA PANEL BY PCR (TYPE A & B)  I-STAT CG4 LACTIC ACID, ED  I-STAT CG4 LACTIC ACID, ED    EKG  EKG Interpretation None       Radiology Dg Chest 2 View  Result Date: 01/14/2018 CLINICAL DATA:  Fever for the past 3 days. No chest pain or shortness of breath. EXAM: CHEST  2 VIEW COMPARISON:  12/15/2007 FINDINGS: Unchanged cardiac silhouette and mediastinal contours. No focal parenchymal opacities. No pleural effusion or pneumothorax. No evidence of edema. No acute osseous abnormalities. Moderate to severe scoliotic curvature of the thoracolumbar spine, unchanged. IMPRESSION: 1. No acute cardiopulmonary disease. Specifically, no evidence of pneumonia. 2. Moderate to severe scoliotic curvature of the thoracolumbar spine, grossly unchanged. Electronically Signed   By: Simonne ComeJohn  Watts M.D.   On: 01/14/2018 17:56    Procedures Procedures (including critical care time)  Medications Ordered in ED Medications  acetaminophen (TYLENOL) tablet 650 mg (650 mg Oral Given 01/14/18 1717)  sodium chloride 0.9 % bolus 1,000 mL (1,000 mLs Intravenous New Bag/Given 01/14/18 1836)     Initial Impression / Assessment and Plan / ED Course  I have reviewed the triage vital signs and the nursing notes.  Pertinent labs & imaging results that were available during my care of the patient were reviewed by me and considered in my medical decision making (see chart for details).     Mother advised of results.  I discussed discharge with close follow up.  She is not comfortable with pt going home.  I will consult family Practice for admission for observation.  Family Practice resident  evaluated pt and feels pt can go home.  They have arraigned follow up with Mother.   Final Clinical Impressions(s) / ED Diagnoses   Final diagnoses:  Fever in other diseases    ED Discharge Orders    None    An After Visit Summary was printed and given to the patient.    Osie CheeksSofia, Leslie K, PA-C 01/14/18 2154    Tegeler, Canary Brimhristopher J, MD 01/15/18 [id number]

## 2018-01-14 NOTE — Telephone Encounter (Signed)
Mom needs letter stating [facility] and is now in the ED.  Please mail the letter

## 2018-01-15 ENCOUNTER — Encounter: Payer: Self-pay | Admitting: Family Medicine

## 2018-01-15 NOTE — Telephone Encounter (Signed)
Letter created and routed to admin pool. 

## 2018-01-16 ENCOUNTER — Ambulatory Visit: Payer: Medicare Other | Admitting: Family Medicine

## 2018-07-01 ENCOUNTER — Ambulatory Visit (INDEPENDENT_AMBULATORY_CARE_PROVIDER_SITE_OTHER): Payer: Medicare Other | Admitting: Family Medicine

## 2018-07-01 ENCOUNTER — Encounter: Payer: Self-pay | Admitting: Family Medicine

## 2018-07-01 VITALS — BP 115/70 | HR 95 | Temp 99.9°F | Wt 184.0 lb

## 2018-07-01 DIAGNOSIS — Z00129 Encounter for routine child health examination without abnormal findings: Secondary | ICD-10-CM

## 2018-07-01 DIAGNOSIS — H1089 Other conjunctivitis: Secondary | ICD-10-CM | POA: Diagnosis not present

## 2018-07-01 DIAGNOSIS — R7303 Prediabetes: Secondary | ICD-10-CM | POA: Diagnosis not present

## 2018-07-01 DIAGNOSIS — Z0001 Encounter for general adult medical examination with abnormal findings: Secondary | ICD-10-CM

## 2018-07-01 DIAGNOSIS — Z23 Encounter for immunization: Secondary | ICD-10-CM

## 2018-07-01 DIAGNOSIS — I10 Essential (primary) hypertension: Secondary | ICD-10-CM

## 2018-07-01 DIAGNOSIS — K5900 Constipation, unspecified: Secondary | ICD-10-CM | POA: Diagnosis not present

## 2018-07-01 DIAGNOSIS — F980 Enuresis not due to a substance or known physiological condition: Secondary | ICD-10-CM | POA: Diagnosis not present

## 2018-07-01 LAB — POCT GLYCOSYLATED HEMOGLOBIN (HGB A1C): Hemoglobin A1C: 5.6 % (ref 4.0–5.6)

## 2018-07-01 MED ORDER — OXYBUTYNIN CHLORIDE 5 MG PO TABS: 10.0000 mg | ORAL_TABLET | Freq: Every day | ORAL | 11 refills | Status: DC

## 2018-07-01 MED ORDER — POLYMYXIN B-TRIMETHOPRIM 10000-0.1 UNIT/ML-% OP SOLN: 1.0000 [drp] | OPHTHALMIC | 1 refills | Status: AC

## 2018-07-01 MED ORDER — POLYETHYLENE GLYCOL 3350 17 GM/SCOOP PO POWD: ORAL | 11 refills | Status: DC

## 2018-07-01 NOTE — Patient Instructions (Signed)
It was great meeting Nicholas Harding today! I think he is doing very well with no issues. I am glad that he has been doing well since going to the urgent care back in January. I will send his labwork to Dr. Orvilla [facility]Pazelock [facility] at [phone]. I will also call you with the results at [phone]. We will be checking his diabetes, kidney, liver, and blood tests today. We will mail you the results as well at [address], [place] [id number].  Today he was also due for his 10 year booster of Tetanus, diphtheria, and pertussis vaccine. If you have any issues please let me know. I will see him back in around 1 year for another physical. If anything pops up in the meantime please let me know.

## 2018-07-01 NOTE — Progress Notes (Signed)
Patient' mother wants a copy of lab results faxed to Dr. Orvilla CornwallPazelock at [facility]. The fax number is [phone].   Mother also wants a copy of results mailed to her home address on file. Address [facility]  [facility]  [address] [place], [place] [address]  .Glennie HawkSimpson, Michelle R, CMA

## 2018-07-02 ENCOUNTER — Encounter: Payer: Self-pay | Admitting: Family Medicine

## 2018-07-02 LAB — COMPREHENSIVE METABOLIC PANEL WITH GFR
ALT: 4 IU/L (ref 0–44)
AST: 13 IU/L (ref 0–40)
Albumin/Globulin Ratio: 1.4 (ref 1.2–2.2)
Albumin: 3.9 g/dL (ref 3.5–5.5)
Alkaline Phosphatase: 62 IU/L (ref 39–117)
BUN/Creatinine Ratio: 12 (ref 9–20)
BUN: 12 mg/dL (ref 6–20)
Bilirubin Total: 0.3 mg/dL (ref 0.0–1.2)
CO2: 24 mmol/L (ref 20–29)
Calcium: 8.9 mg/dL (ref 8.7–10.2)
Chloride: 106 mmol/L (ref 96–106)
Creatinine, Ser: 0.98 mg/dL (ref 0.76–1.27)
GFR calc Af Amer: 117 mL/min/1.73
GFR calc non Af Amer: 101 mL/min/1.73
Globulin, Total: 2.7 g/dL (ref 1.5–4.5)
Glucose: 90 mg/dL (ref 65–99)
Potassium: 4.4 mmol/L (ref 3.5–5.2)
Sodium: 142 mmol/L (ref 134–144)
Total Protein: 6.6 g/dL (ref 6.0–8.5)

## 2018-07-02 LAB — CBC WITH DIFFERENTIAL/PLATELET
Basophils Absolute: 0 x10E3/uL (ref 0.0–0.2)
Basos: 0 %
EOS (ABSOLUTE): 0.5 x10E3/uL — ABNORMAL HIGH (ref 0.0–0.4)
Eos: 10 %
Hematocrit: 40.4 % (ref 37.5–51.0)
Hemoglobin: 13.6 g/dL (ref 13.0–17.7)
Immature Grans (Abs): 0 x10E3/uL (ref 0.0–0.1)
Immature Granulocytes: 0 %
Lymphocytes Absolute: 2 x10E3/uL (ref 0.7–3.1)
Lymphs: 36 %
MCH: 30.2 pg (ref 26.6–33.0)
MCHC: 33.7 g/dL (ref 31.5–35.7)
MCV: 90 fL (ref 79–97)
Monocytes Absolute: 0.4 x10E3/uL (ref 0.1–0.9)
Monocytes: 7 %
Neutrophils Absolute: 2.5 x10E3/uL (ref 1.4–7.0)
Neutrophils: 47 %
Platelets: 218 x10E3/uL (ref 150–450)
RBC: 4.51 x10E6/uL (ref 4.14–5.80)
RDW: 13.7 % (ref 12.3–15.4)
WBC: 5.4 x10E3/uL (ref 3.4–10.8)

## 2018-07-02 NOTE — Progress Notes (Signed)
   HPI 32 year old who presents for annual wellness visit. He has no issues recently. Noted to have a fever and ED visit in 12/2017 but has since had no issues. He goes almost daily to a special needs group. They do various activities such as go to the mall, walk around parks, play basketball, and other activities to keep him active.   CC: annual wellness visit   ROS: Review of Systems  Constitutional: Negative for chills and fever.  HENT: Negative for ear discharge, ear pain and sinus pain.   Eyes: Negative for pain.  Respiratory: Negative for cough, sputum production, shortness of breath and wheezing.   Cardiovascular: Negative for chest pain, palpitations and orthopnea.  Gastrointestinal: Negative for abdominal pain, diarrhea, nausea and vomiting.  Genitourinary: Negative for dysuria, frequency and urgency.  Musculoskeletal: Negative for myalgias and neck pain.  Skin: Negative for itching and rash.  Neurological: Negative for dizziness.  Psychiatric/Behavioral: The patient is not nervous/anxious.     Review of Systems See HPI for ROS.   CC, SH/smoking status, and VS noted  Objective: BP 115/70 (BP Location: Left Arm, Patient Position: Sitting, Cuff Size: Normal)   Pulse 95   Temp 99.9 F (37.7 C) (Oral)   Wt 184 lb (83.5 kg)   SpO2 97%   BMI 29.70 kg/m  Gen: NAD, alert, cooperative, and pleasant. Well appearing, AA male resting comfortably in chair. HEENT: NCAT, EOMI, PERRL. Good dentition, no exudates on posterior pharynx CV: RRR, no m/r/g. Palpable peripheral pulses Resp: Clear to auscultation bilaterally , no wheezes, non-labored Abd: SNTND, BS present, no guarding or organomegaly Ext: No edema, warm Neuro:  No focal neuro deficits. Non verbal. Alert, reacts to sounds, vision intact   Assessment and plan:  HYPERTENSION, BENIGN SYSTEMIC Patient with past history of hypertension. Has been getting yearly CMPs draw. Will draw to evaluate liver and kidney  function.  Pre-diabetes A1C 5.7 at last visit. Will draw A1C as this places him in pre-diabetes range.  Other conjunctivitis Gets recurrent bacterial conjunctivitis likely 2/2 to his autism. Refilled polymyxin ointment more for as needed use.  ENURESIS Refilled prescription for oxybutynin  Encounter for routine child health examination without abnormal findings Drawing cbc, cmp, a1c. Will call his mother with results. Due for TDAP, administered in clinic. Now up to date with health maintenance.  Constipation Refilled script for miralax.   Orders Placed This Encounter  Procedures  . Tdap vaccine greater than or equal to 7yo IM  . CBC with Differential  . Comprehensive metabolic panel    Order Specific Question:   Has the patient fasted?    Answer:   No  . POCT A1C    Meds ordered this encounter  Medications  . oxybutynin (DITROPAN) 5 MG tablet    Sig: Take 2 tablets (10 mg total) by mouth at bedtime.    Dispense:  60 tablet    Refill:  11  . polyethylene glycol powder (GLYCOLAX/MIRALAX) powder    Sig: Give 0.5-1 capful as needed for regular, soft BM daily.    Dispense:  578 g    Refill:  11  . trimethoprim-polymyxin b (POLYTRIM) ophthalmic solution    Sig: Place 1 drop into the right eye every 4 (four) hours.    Dispense:  10 mL    Refill:  1   Myrene BuddyJacob Offie Waide MD PGY-2 Family Medicine Resident  07/02/2018 12:03 PM

## 2018-07-02 NOTE — Assessment & Plan Note (Signed)
Refilled script for miralax.

## 2018-07-02 NOTE — Assessment & Plan Note (Signed)
A1C 5.7 at last visit. Will draw A1C as this places him in pre-diabetes range.

## 2018-07-02 NOTE — Assessment & Plan Note (Signed)
Gets recurrent bacterial conjunctivitis likely 2/2 to his autism. Refilled polymyxin ointment more for as needed use.

## 2018-07-02 NOTE — Assessment & Plan Note (Addendum)
Drawing cbc, cmp, a1c. Will call his mother with results. Due for TDAP, administered in clinic. Now up to date with health maintenance.

## 2018-07-02 NOTE — Assessment & Plan Note (Signed)
Refilled prescription for oxybutynin

## 2018-07-02 NOTE — Assessment & Plan Note (Signed)
Patient with past history of hypertension. Has been getting yearly CMPs draw. Will draw to evaluate liver and kidney function.

## 2018-07-23 ENCOUNTER — Other Ambulatory Visit: Payer: Self-pay | Admitting: Family Medicine

## 2018-07-23 DIAGNOSIS — F980 Enuresis not due to a substance or known physiological condition: Secondary | ICD-10-CM

## 2019-06-14 ENCOUNTER — Ambulatory Visit: Payer: Medicare Other | Admitting: Family Medicine

## 2019-06-14 ENCOUNTER — Other Ambulatory Visit: Payer: Self-pay

## 2019-06-14 ENCOUNTER — Ambulatory Visit (INDEPENDENT_AMBULATORY_CARE_PROVIDER_SITE_OTHER): Payer: Medicare Other | Admitting: Family Medicine

## 2019-06-14 ENCOUNTER — Encounter: Payer: Self-pay | Admitting: Family Medicine

## 2019-06-14 VITALS — BP 92/62 | HR 68 | Temp 98.8°F

## 2019-06-14 DIAGNOSIS — H1013 Acute atopic conjunctivitis, bilateral: Secondary | ICD-10-CM | POA: Diagnosis not present

## 2019-06-14 DIAGNOSIS — J309 Allergic rhinitis, unspecified: Secondary | ICD-10-CM | POA: Diagnosis not present

## 2019-06-14 DIAGNOSIS — H1089 Other conjunctivitis: Secondary | ICD-10-CM | POA: Diagnosis not present

## 2019-06-14 MED ORDER — FLUTICASONE PROPIONATE 50 MCG/ACT NA SUSP: 2.0000 | Freq: Every day | NASAL | 6 refills | Status: DC

## 2019-06-14 MED ORDER — OLOPATADINE HCL 0.1 % OP SOLN: 1.0000 [drp] | Freq: Two times a day (BID) | OPHTHALMIC | 0 refills | Status: AC

## 2019-06-14 MED ORDER — FLUTICASONE PROPIONATE 50 MCG/ACT NA SUSP: 2.0000 | Freq: Every day | NASAL | 1 refills | Status: DC

## 2019-06-14 NOTE — Assessment & Plan Note (Addendum)
Bilateral mild conjunctival injection and boggy nasal mucosa associated with allergic shiners.  Afebrile, pupils reactive and eye movements intact. Suspect seasonal allergic component to conjunctivitis given presentation and recurrence of symptoms yearly at the beginning of summer. Could continue to consider viral conjunctivitis, however less likely given above. Low suspicion for bacterial source given bilateral nature and minimal discharge/watery. - Instructed mother she can continue to use antihistamine eyedrops for symptomatic relief, sent in Patanol eyedrops to the pharmacy - Prescribe Flonase nasal spray - Could consider an oral antihistamine in the future, mother hesitant to oral medication currently - Follow-up if symptoms worsening or not improving within the next week - Provided return to [employer] note

## 2019-06-14 NOTE — Patient Instructions (Signed)
It was wonderful meeting you both today.  His eye redness is likely secondary to allergic component, I have sent in a nasal spray that can help with this.  May continue to use a lubricating eyedrops to help with symptoms.  If his symptoms do not improve or worsen within the next week, please follow-up.

## 2019-06-14 NOTE — Progress Notes (Signed)
Subjective:    Patient ID: Nicholas Harding, male    DOB: April 04, 1986, 33 y.o.   MRN: [id number]   CC: concern for pink eye  HPI: Nicholas Harding is a 33 year old male, with a history of autism and nonverbal at baseline, presenting with his mother to discuss the following:  Bilateral eye redness: 5-day history, both eyes have been slightly red and a little bit swollen underneath.  Has not worsened since onset, however not improved. Mom says this happens every year around the exact same time, usually lasts for a week or so.  She has been giving him OTC antihistamine eyedrops which seem to help symptomatically, however no change in eye redness.  He is been having some eye crusting in the morning when he wakes up, and increased watery eyes but not a whole bunch that mom has noticed.  He has not been showing any signs of eye pain, she does not believe he has been rubbing his eyes more.  Has had some mild nasal drainage.  Denies any associated fever, difficulty with eye movements, skin erythema/swelling, thick eye drainage, sore throat, SOB, coughing.  Has been acting his normal self otherwise, eating and drinking appropriately.  Goes to [employer] of care on a daily basis, has been out since Thursday due to this.  Mom is going to know if he can return.  No one else has had similar symptoms at the center.  Note he does have a history of bacterial conjunctivitis listed in problem list.  Review of Systems Per HPI   Patient Active Problem List   Diagnosis Date Noted  . Allergic rhinoconjunctivitis of both eyes 06/14/2019  . Pre-diabetes 07/01/2018  . Fever 01/14/2018  . Encounter for routine child health examination without abnormal findings 06/27/2017  . Obesity (BMI 30-39.9) 06/27/2017  . Eye drainage 06/27/2017  . 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 Routine history and physical examination of adult 05/20/2012  . Other conjunctivitis 01/29/2012  . Thickening, skin 01/29/2012  . Constipation  05/01/2011  . ENURESIS 03/08/2010  . Active autistic disorder 04/05/2008  . Major depressive disorder, recurrent episode ([place]) 02/19/2007  . 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 HYPERTENSION, BENIGN SYSTEMIC 02/19/2007     Objective:  BP 92/62   Pulse 68   Temp 98.8 F (37.1 C) (Axillary)   SpO2 98%  Vitals and nursing note reviewed  General: Pleasant gentleman in no acute distress HEENT: Bilateral conjunctival mildly injected, some crusting noted in the medial portion of eye, no purulent drainage noted.  Bilateral eyes appear watery. EOMI, PERRLA.  Slight darkening and mild soft tissue swelling underneath bilateral eyes.  Boggy nasal mucosa.  No cervical lymphadenopathy palpated.  Bilateral external canals with cerumen. Cardiac: RRR, normal heart sounds, no murmurs Respiratory: CTAB, normal effort Abdomen: soft, nontender, nondistended Extremities: no edema or cyanosis. WWP. Skin: warm and dry, no rashes noted Neuro: alert and oriented, no focal deficits, nonverbal at baseline  Assessment & Plan:   Allergic rhinoconjunctivitis of both eyes Bilateral mild conjunctival injection and boggy nasal mucosa associated with allergic shiners.  Afebrile, pupils reactive and eye movements intact. Suspect seasonal allergic component to conjunctivitis given presentation and recurrence of symptoms yearly at the beginning of summer. Could continue to consider viral conjunctivitis, however less likely given above. Low suspicion for bacterial source given bilateral nature and minimal discharge/watery. - Instructed mother she can continue to use antihistamine eyedrops for symptomatic relief, sent in Patanol eyedrops to the pharmacy - Prescribe Flonase nasal spray -  Could consider an oral antihistamine in the future, mother hesitant to oral medication currently - Follow-up if symptoms worsening or not improving within the next week - Provided return to [facility] note   Follow-up if symptoms  not improving or worsening  Leticia PennaSamantha Nahshon Reich, DO Family Medicine Resident PGY-1

## 2019-07-08 ENCOUNTER — Encounter: Payer: Self-pay | Admitting: Family Medicine

## 2019-07-08 ENCOUNTER — Other Ambulatory Visit: Payer: Self-pay

## 2019-07-08 ENCOUNTER — Ambulatory Visit (INDEPENDENT_AMBULATORY_CARE_PROVIDER_SITE_OTHER): Payer: Medicare Other | Admitting: Family Medicine

## 2019-07-08 VITALS — BP 110/64 | HR 93

## 2019-07-08 DIAGNOSIS — K5900 Constipation, unspecified: Secondary | ICD-10-CM

## 2019-07-08 DIAGNOSIS — F339 Major depressive disorder, recurrent, unspecified: Secondary | ICD-10-CM

## 2019-07-08 DIAGNOSIS — Z0000 Encounter for general adult medical examination without abnormal findings: Secondary | ICD-10-CM | POA: Diagnosis not present

## 2019-07-08 DIAGNOSIS — R300 Dysuria: Secondary | ICD-10-CM | POA: Diagnosis not present

## 2019-07-08 DIAGNOSIS — F980 Enuresis not due to a substance or known physiological condition: Secondary | ICD-10-CM

## 2019-07-08 DIAGNOSIS — R7303 Prediabetes: Secondary | ICD-10-CM

## 2019-07-08 MED ORDER — OXYBUTYNIN CHLORIDE 5 MG PO TABS: 10.0000 mg | ORAL_TABLET | Freq: Every day | ORAL | 0 refills | Status: DC

## 2019-07-08 MED ORDER — POLYETHYLENE GLYCOL 3350 17 GM/SCOOP PO POWD: ORAL | 11 refills | Status: DC

## 2019-07-08 NOTE — Patient Instructions (Addendum)
It was great seeing Nicholas Harding today!  Everything was great on exam.  We will get some blood work to better evaluate his diabetes, blood work, and kidney and liver function.  I will give you a call with these results.  I gave you a printed prescription for his MiraLAX and his oxybutynin.

## 2019-07-09 ENCOUNTER — Telehealth: Payer: Self-pay | Admitting: *Deleted

## 2019-07-09 LAB — COMPREHENSIVE METABOLIC PANEL
ALT: 6 IU/L (ref 0–44)
AST: 18 IU/L (ref 0–40)
Albumin/Globulin Ratio: 1.4 (ref 1.2–2.2)
Albumin: 3.9 g/dL — ABNORMAL LOW (ref 4.0–5.0)
Alkaline Phosphatase: 63 IU/L (ref 39–117)
BUN/Creatinine Ratio: 14 (ref 9–20)
BUN: 14 mg/dL (ref 6–20)
Bilirubin Total: 0.2 mg/dL (ref 0.0–1.2)
CO2: 24 mmol/L (ref 20–29)
Calcium: 9.1 mg/dL (ref 8.7–10.2)
Chloride: 104 mmol/L (ref 96–106)
Creatinine, Ser: 1 mg/dL (ref 0.76–1.27)
GFR calc Af Amer: 113 mL/min/{1.73_m2} (ref >59)
GFR calc non Af Amer: 98 mL/min/{1.73_m2} (ref >59)
Globulin, Total: 2.7 g/dL (ref 1.5–4.5)
Glucose: 67 mg/dL (ref 65–99)
Potassium: 4 mmol/L (ref 3.5–5.2)
Sodium: 142 mmol/L (ref 134–144)
Total Protein: 6.6 g/dL (ref 6.0–8.5)

## 2019-07-09 LAB — CBC WITH DIFFERENTIAL/PLATELET
Basophils Absolute: 0 10*3/uL (ref 0.0–0.2)
Basos: 1 %
EOS (ABSOLUTE): 0.6 10*3/uL — ABNORMAL HIGH (ref 0.0–0.4)
Eos: 9 %
Hematocrit: 42.5 % (ref 37.5–51.0)
Hemoglobin: 14 g/dL (ref 13.0–17.7)
Immature Grans (Abs): 0 10*3/uL (ref 0.0–0.1)
Immature Granulocytes: 0 %
Lymphocytes Absolute: 2.3 10*3/uL (ref 0.7–3.1)
Lymphs: 38 %
MCH: 30.2 pg (ref 26.6–33.0)
MCHC: 32.9 g/dL (ref 31.5–35.7)
MCV: 92 fL (ref 79–97)
Monocytes Absolute: 0.6 10*3/uL (ref 0.1–0.9)
Monocytes: 9 %
Neutrophils Absolute: 2.6 10*3/uL (ref 1.4–7.0)
Neutrophils: 43 %
Platelets: 223 10*3/uL (ref 150–450)
RBC: 4.64 x10E6/uL (ref 4.14–5.80)
RDW: 12.2 % (ref 11.6–15.4)
WBC: 6.1 10*3/uL (ref 3.4–10.8)

## 2019-07-09 LAB — HEMOGLOBIN A1C
Est. average glucose Bld gHb Est-mCnc: 117 mg/dL
Hgb A1c MFr Bld: 5.7 % — ABNORMAL HIGH (ref 4.8–5.6)

## 2019-07-09 NOTE — Telephone Encounter (Signed)
Mom calls and request a years worth of refill on oxybutin mailed to her house.   Will forward to MD. [name], CMA

## 2019-07-10 ENCOUNTER — Encounter: Payer: Self-pay | Admitting: Family Medicine

## 2019-07-10 NOTE — Assessment & Plan Note (Signed)
Patient with diagnoses of MDD.  Has apparently resolved for quite some time.

## 2019-07-10 NOTE — Assessment & Plan Note (Signed)
Patient with no abnormality appreciated on exam aside from being nonverbal.  Up-to-date on all health maintenance screening.

## 2019-07-10 NOTE — Progress Notes (Signed)
  Patient has severe autism and is nonverbal at baseline, his mother accompanied him to the visit  HPI 33 year old male presents for annual physical.  Per his mother has been doing very well with no issues.  He still spends time doing recreational activities.  He is getting good exercise, and eating well.  He is up-to-date on all his health screening measures.   CC: Annual physical   ROS:   Review of Systems See HPI for ROS.   CC, SH/smoking status, and VS noted  Objective: BP 110/64   Pulse 93   SpO2 54%  Gen: 33 year old African-American male, nonverbal at baseline, no acute distress HEENT: NCAT, EOMI, PERRL CV: RRR, no murmur Resp: CTAB, no wheezes, non-labored Abd: SNTND, BS present, no guarding or organomegaly Ext: No edema, warm Neuro: Alert and oriented, Speech clear, No gross deficits   Assessment and plan:  Major depressive disorder, recurrent episode [facility]) Patient with diagnoses of MDD.  Has apparently resolved for quite some time.  Routine history and physical examination of adult Patient with no abnormality appreciated on exam aside from being nonverbal.  Up-to-date on all health maintenance screening.   Orders Placed This Encounter  Procedures  . Comprehensive metabolic panel    Order Specific Question:   Has the patient fasted?    Answer:   No  . CBC with Differential  . Hemoglobin A1c    Meds ordered this encounter  Medications  . oxybutynin (DITROPAN) 5 MG tablet    Sig: Take 2 tablets (10 mg total) by mouth at bedtime.    Dispense:  60 tablet    Refill:  0  . polyethylene glycol powder (GLYCOLAX/MIRALAX) 17 GM/SCOOP powder    Sig: Give 0.5-1 capful as needed for regular, soft BM daily.    Dispense:  578 g    Refill:  11     Guadalupe Dawn MD PGY-3 Family Medicine Resident  07/10/2019 11:40 PM

## 2019-07-12 ENCOUNTER — Other Ambulatory Visit: Payer: Self-pay | Admitting: Family Medicine

## 2019-07-12 DIAGNOSIS — F980 Enuresis not due to a substance or known physiological condition: Secondary | ICD-10-CM

## 2019-07-12 MED ORDER — OXYBUTYNIN CHLORIDE ER 10 MG PO TB24: 10.0000 mg | ORAL_TABLET | Freq: Every day | ORAL | 0 refills | Status: DC

## 2019-07-12 NOTE — Progress Notes (Signed)
Sent script for oxybutynin 10mg  XL 330 tablets which will be a one year supply coupled with the one month supply I gave them in clinic.  Guadalupe Dawn MD PGY-3 Family Medicine Resident

## 2019-07-12 NOTE — Telephone Encounter (Signed)
Mom calling to check status. [name], CMA

## 2019-07-13 NOTE — Telephone Encounter (Signed)
Accidentally left my note under an order only visit yesterday. I sent in an 11 month supply to the [facility]. Coupled with the one month I gave them in clinic should last them a year.  Guadalupe Dawn MD PGY-3 Family Medicine Resident

## 2019-07-14 ENCOUNTER — Other Ambulatory Visit: Payer: Self-pay

## 2019-07-14 MED ORDER — OXYBUTYNIN CHLORIDE ER 10 MG PO TB24: 10.0000 mg | ORAL_TABLET | Freq: Every day | ORAL | 0 refills | Status: DC

## 2019-07-20 ENCOUNTER — Other Ambulatory Visit: Payer: Self-pay

## 2019-07-20 ENCOUNTER — Telehealth: Payer: Medicare Other | Admitting: Family Medicine

## 2019-07-20 DIAGNOSIS — K5900 Constipation, unspecified: Secondary | ICD-10-CM

## 2019-07-20 DIAGNOSIS — H1089 Other conjunctivitis: Secondary | ICD-10-CM

## 2019-07-20 MED ORDER — FLUTICASONE PROPIONATE 50 MCG/ACT NA SUSP: 2.0000 | Freq: Every day | NASAL | 6 refills | Status: DC

## 2019-07-20 MED ORDER — POLYETHYLENE GLYCOL 3350 17 GM/SCOOP PO POWD: ORAL | 11 refills | Status: DC

## 2019-07-22 NOTE — Progress Notes (Signed)
[facility] Telemedicine Visit  Patient consented to have virtual visit. Method of visit: Telephone  Encounter participants: Patient: Nicholas Harding - located at home Provider: Guadalupe Dawn - located at [facility] Others (if applicable): Sherlynn Stalls Baik, mother, and home  Chief Complaint: Discuss lab results  HPI: Patient mother calling in to discuss lab results from recent visit.  Discussed A1c essentially unchanged at 5.7.  CBC, CMP without any abnormality.    Given that only previous lab results were discussed, we will no charge for visit.

## 2019-08-27 ENCOUNTER — Other Ambulatory Visit: Payer: Self-pay

## 2019-09-28 ENCOUNTER — Telehealth: Payer: Self-pay | Admitting: *Deleted

## 2019-09-28 NOTE — Telephone Encounter (Signed)
Mom calls because there is a form in PCPs box.   They are moving to [place] in March to live with her other son.  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  She also needs the form to state that he has autism and muscular dystrophy (states this was Dx by a previous ortho MD) which is shy he cant be alone.  To PCP. [name], CMA

## 2019-09-30 NOTE — Telephone Encounter (Signed)
**  late entry**  Filled out form and placed in fax box on 10/6.  Guadalupe Dawn MD PGY-3 Family Medicine Resident

## 2019-10-08 ENCOUNTER — Other Ambulatory Visit: Payer: Self-pay

## 2019-10-08 DIAGNOSIS — Z20822 Contact with and (suspected) exposure to covid-19: Secondary | ICD-10-CM

## 2019-10-10 LAB — NOVEL CORONAVIRUS, NAA: SARS-CoV-2, NAA: NOT DETECTED

## 2019-10-27 ENCOUNTER — Other Ambulatory Visit: Payer: Self-pay

## 2019-10-27 MED ORDER — OXYBUTYNIN CHLORIDE ER 10 MG PO TB24: 10.0000 mg | ORAL_TABLET | Freq: Every day | ORAL | 0 refills | Status: DC

## 2019-10-29 ENCOUNTER — Other Ambulatory Visit: Payer: Self-pay | Admitting: Family Medicine

## 2019-11-02 ENCOUNTER — Other Ambulatory Visit: Payer: Self-pay | Admitting: *Deleted

## 2019-11-02 MED ORDER — OXYBUTYNIN CHLORIDE ER 10 MG PO TB24: 10.0000 mg | ORAL_TABLET | Freq: Every day | ORAL | 0 refills | Status: DC

## 2019-11-05 ENCOUNTER — Other Ambulatory Visit: Payer: Self-pay

## 2019-11-05 DIAGNOSIS — Z20822 Contact with and (suspected) exposure to covid-19: Secondary | ICD-10-CM

## 2019-11-08 LAB — NOVEL CORONAVIRUS, NAA: SARS-CoV-2, NAA: NOT DETECTED

## 2020-02-20 ENCOUNTER — Other Ambulatory Visit: Payer: Self-pay | Admitting: Family Medicine

## 2020-05-07 ENCOUNTER — Other Ambulatory Visit: Payer: Self-pay | Admitting: Family Medicine

## 2020-07-10 ENCOUNTER — Ambulatory Visit: Payer: Medicare Other | Admitting: Family Medicine

## 2020-07-18 ENCOUNTER — Other Ambulatory Visit: Payer: Self-pay

## 2020-07-18 ENCOUNTER — Encounter: Payer: Self-pay | Admitting: Family Medicine

## 2020-07-18 ENCOUNTER — Ambulatory Visit (INDEPENDENT_AMBULATORY_CARE_PROVIDER_SITE_OTHER): Payer: Medicare Other | Admitting: Family Medicine

## 2020-07-18 VITALS — BP 102/66 | HR 87 | Ht 66.0 in | Wt 179.1 lb

## 2020-07-18 DIAGNOSIS — R7303 Prediabetes: Secondary | ICD-10-CM

## 2020-07-18 DIAGNOSIS — K5900 Constipation, unspecified: Secondary | ICD-10-CM | POA: Diagnosis not present

## 2020-07-18 DIAGNOSIS — Z0000 Encounter for general adult medical examination without abnormal findings: Secondary | ICD-10-CM | POA: Diagnosis not present

## 2020-07-18 DIAGNOSIS — H6123 Impacted cerumen, bilateral: Secondary | ICD-10-CM | POA: Diagnosis not present

## 2020-07-18 MED ORDER — OXYBUTYNIN CHLORIDE ER 10 MG PO TB24: 10.0000 mg | ORAL_TABLET | Freq: Every day | ORAL | 0 refills | Status: DC

## 2020-07-18 MED ORDER — POLYETHYLENE GLYCOL 3350 17 GM/SCOOP PO POWD: ORAL | 11 refills | Status: DC

## 2020-07-18 MED ORDER — DEBROX 6.5 % OT SOLN: 5.0000 [drp] | Freq: Two times a day (BID) | OTIC | 0 refills | Status: AC

## 2020-07-18 NOTE — Progress Notes (Signed)
    SUBJECTIVE:   Chief compliant/HPI: annual examination  Nicholas Harding is a 34 y.o. who presents today with [facility]) for an annual exam. At baseline he is nonverbal but able to comprehend and follow commands according to mother. She does not have any concerns at this time.  Reviewed and updated history: Follows with the [facility] every 3 months.   Review of systems form notable for occasional eye drainage, intermittent constipation, and nocturnal enuresis.   OBJECTIVE:   BP 102/66   Pulse 87   Ht 5\' 6"  (1.676 m)   Wt 179 lb 2 oz (81.3 kg)   SpO2 96%   BMI 28.91 kg/m   Physical Exam Constitutional:      Appearance: Normal appearance.  HENT:     Head: Normocephalic.     Right Ear: There is impacted cerumen.     Left Ear: There is impacted cerumen.     Nose: Nose normal.     Mouth/Throat:     Mouth: Mucous membranes are moist.     Pharynx: Oropharynx is clear.  Eyes:     Extraocular Movements: Extraocular movements intact.     Conjunctiva/sclera: Conjunctivae normal.     Pupils: Pupils are equal, round, and reactive to light.  Cardiovascular:     Rate and Rhythm: Normal rate and regular rhythm.     Pulses: Normal pulses.     Heart sounds: Normal heart sounds.  Pulmonary:     Effort: Pulmonary effort is normal.     Breath sounds: Normal breath sounds.  Abdominal:     General: Abdomen is flat. Bowel sounds are normal.     Palpations: Abdomen is soft.  Neurological:     Mental Status: He is alert. Mental status is at baseline.  Psychiatric:        Mood and Affect: Mood normal.        Behavior: Behavior normal.    ASSESSMENT/PLAN:   Mr. Welles appears to be doing well. He is autistic and is a part of a recreational group that mother thinks he enjoys. He goes several times a week. This will continue to be beneficial for him and continuing to develop socially. He will continue to follow up with for his mental health  needs. He is pre-diabetic with A1c of 5.7 unchanged from 1 year ago. Counseled on remaining active and eating a balanced diet. Debrox drops given for bilaterally impacted cerumen.   Annual Examination  Blood pressure reviewed and at goal.   Considered the following items based upon USPSTF recommendations: HIV testing: not indicated NR 06/18/2016 Hepatitis C: ordered Negative 07/18/20 Hepatitis B: not indicated Syphilis if at high risk: {not indicated GC/CTnot indicated Lipid panel (nonfasting or fasting) discussed based upon AHA recommendations and not ordered.  Consider repeat every 4-6 years. Reviewed risk factors for latent tuberculosis and not indicated Immunizations: Needs Covid vaccine x2, will need to be addressed at next visit.   Follow up in 1 year or sooner if indicated.   07/20/20, DO [facility]

## 2020-07-18 NOTE — Patient Instructions (Signed)
It was very nice to meet you today. Please enjoy the rest of your week. Today you were seen for an annual wellness exam. I will call with abnormal results otherwise I will communicate via mychart. I have prescribed debrox drops to clear his earwax. Follow up in 1-2 years for annual wellness exam or sooner if needed.   Please call the clinic at [phone] if you have any concerns. It was our pleasure to serve you.

## 2020-07-19 LAB — HEMOGLOBIN A1C
Est. average glucose Bld gHb Est-mCnc: 117 mg/dL
Hgb A1c MFr Bld: 5.7 % — ABNORMAL HIGH (ref 4.8–5.6)

## 2020-07-19 LAB — HEPATITIS C ANTIBODY: Hep C Virus Ab: <0.1 s/co ratio (ref 0.0–0.9)

## 2020-07-20 ENCOUNTER — Other Ambulatory Visit: Payer: Self-pay | Admitting: Family Medicine

## 2020-10-02 ENCOUNTER — Other Ambulatory Visit: Payer: Self-pay | Admitting: Family Medicine

## 2020-12-19 ENCOUNTER — Other Ambulatory Visit: Payer: Self-pay | Admitting: Family Medicine

## 2021-02-26 ENCOUNTER — Encounter: Payer: Self-pay | Admitting: Family Medicine

## 2021-02-26 ENCOUNTER — Ambulatory Visit (INDEPENDENT_AMBULATORY_CARE_PROVIDER_SITE_OTHER): Payer: Medicare Other | Admitting: Family Medicine

## 2021-02-26 ENCOUNTER — Other Ambulatory Visit: Payer: Self-pay

## 2021-02-26 DIAGNOSIS — F840 Autistic disorder: Secondary | ICD-10-CM | POA: Diagnosis not present

## 2021-02-26 DIAGNOSIS — R7303 Prediabetes: Secondary | ICD-10-CM | POA: Diagnosis present

## 2021-02-26 DIAGNOSIS — R634 Abnormal weight loss: Secondary | ICD-10-CM

## 2021-02-26 LAB — POCT GLYCOSYLATED HEMOGLOBIN (HGB A1C): Hemoglobin A1C: 5.3 % (ref 4.0–5.6)

## 2021-02-26 NOTE — Patient Instructions (Signed)
I am reassured by my exam.  I doubt anything serious is going on.   I will call tomorrow with the blood work I think he has gained a few pounds and likely what you are doing is working. Even if the blood work is good, he should be rechecked in one month to make sure the weight is OK.

## 2021-02-27 ENCOUNTER — Encounter: Payer: Self-pay | Admitting: Family Medicine

## 2021-02-27 LAB — CMP14+EGFR
ALT: 6 IU/L (ref 0–44)
AST: 15 IU/L (ref 0–40)
Albumin/Globulin Ratio: 1.4 (ref 1.2–2.2)
Albumin: 4.4 g/dL (ref 4.0–5.0)
Alkaline Phosphatase: 70 IU/L (ref 44–121)
BUN/Creatinine Ratio: 11 (ref 9–20)
BUN: 11 mg/dL (ref 6–20)
Bilirubin Total: 0.5 mg/dL (ref 0.0–1.2)
CO2: 21 mmol/L (ref 20–29)
Calcium: 9.7 mg/dL (ref 8.7–10.2)
Chloride: 104 mmol/L (ref 96–106)
Creatinine, Ser: 1.03 mg/dL (ref 0.76–1.27)
Globulin, Total: 3.2 g/dL (ref 1.5–4.5)
Glucose: 109 mg/dL — ABNORMAL HIGH (ref 65–99)
Potassium: 4.1 mmol/L (ref 3.5–5.2)
Sodium: 141 mmol/L (ref 134–144)
Total Protein: 7.6 g/dL (ref 6.0–8.5)
eGFR: 97 mL/min/{1.73_m2} (ref >59)

## 2021-02-27 LAB — CBC
Hematocrit: 44.7 % (ref 37.5–51.0)
Hemoglobin: 15.1 g/dL (ref 13.0–17.7)
MCH: 29.8 pg (ref 26.6–33.0)
MCHC: 33.8 g/dL (ref 31.5–35.7)
MCV: 88 fL (ref 79–97)
Platelets: 231 10*3/uL (ref 150–450)
RBC: 5.06 x10E6/uL (ref 4.14–5.80)
RDW: 12.4 % (ref 11.6–15.4)
WBC: 5.5 10*3/uL (ref 3.4–10.8)

## 2021-02-27 LAB — TSH: TSH: 1.73 u[IU]/mL (ref 0.450–4.500)

## 2021-02-27 NOTE — Assessment & Plan Note (Signed)
Stable.  Non verbal status handicaps the usual history taking.

## 2021-02-27 NOTE — Assessment & Plan Note (Signed)
Exam is reassuring Blood work is reassuring. (Called mom and informed her of labs.) Recent 7 lb wt gain is reassuring. Continue nutritional supplements. FU 4-6 weeks for weight recheck.

## 2021-02-27 NOTE — Progress Notes (Signed)
    SUBJECTIVE:   CHIEF COMPLAINT / HPI:   Unintentional weight loss.  Patient was seen at the [facility] clinic and mother was surprised to learn of significant weight loss.  (Wt down from 180 to 140.)  They recommended nutritional supplements, which he has been taking.  Patient is non verbal with autism.  Mom knows he is a picky eater - only eats when he is ready.  She is flexible with feeding and gets it down.  He is in adult daycare 8AM to 2PM.  Mom doubts that the daycare folks are as flexible for lunch.  No previous unintended wt loss.  His baseline weight is 175-180 lbs.  No abd pain, nausea, vomiting, fever.  No new meds.    OBJECTIVE:   BP 110/70   Pulse 99   Ht 5\' 6"  (1.676 m)   Wt 147 lb 6.4 oz (66.9 kg)   SpO2 95%   BMI 23.79 kg/m   With the caveat that we have different scales, weight is up to 147 - a 7 lb increase since mom started using boost. HEENT WNL Neck supple without nodes. Lungs clear Cardiac RRR without m or g Abd benign.  ASSESSMENT/PLAN:   No problem-specific Assessment & Plan notes found for this encounter.     , MD [facility]

## 2021-03-02 ENCOUNTER — Other Ambulatory Visit: Payer: Self-pay | Admitting: Family Medicine

## 2021-04-02 ENCOUNTER — Other Ambulatory Visit: Payer: Self-pay

## 2021-04-02 ENCOUNTER — Ambulatory Visit (INDEPENDENT_AMBULATORY_CARE_PROVIDER_SITE_OTHER): Payer: Medicare Other | Admitting: Family Medicine

## 2021-04-02 ENCOUNTER — Encounter: Payer: Self-pay | Admitting: Family Medicine

## 2021-04-02 DIAGNOSIS — R634 Abnormal weight loss: Secondary | ICD-10-CM

## 2021-04-02 NOTE — Progress Notes (Signed)
    SUBJECTIVE:   CHIEF COMPLAINT / HPI:   FU weight loss.  Please see my note of 02/26/21.  Patient with unexplained wt loss here for one month follow up.  Initial labs are all reassuring.  He has severe autism and therefore cannot provide a history.  His caregiver/mother is not aware of any new or localizing symptoms.  She has worked hard to increase his caloric intake over the past month and has also worked with his adult daycare center.  His weight has stabilized.    Two issues seem to be contributors. 1. As part of his autism, he fidgets and moves constantly.  I suspect that his constant "exercising" causes a high daily caloric need. 2. He had the most significant weight loss over a month when his mother was hospitalized and he was cared for by someone else.    OBJECTIVE:   Ht 5\' 6"  (1.676 m)   Wt 146 lb 3.2 oz (66.3 kg)   BMI 23.60 kg/m   Minimal exam done.  He does a lot of moving, rocking and pacing while in the room  ASSESSMENT/PLAN:   Unintentional weight loss Stablilized with focus on higher caloric intake.  Mom will continue to push high calorie foods.  We can recheck in 2 months.  Sooner as needed if new or worsening symptoms.      , MD [facility]

## 2021-04-02 NOTE — Patient Instructions (Signed)
I am pleased that he is not losing more weight and that all the blood tests were fine. I think he needs a lot of calories because he moves so much.  I would like to see him again in two months to check his weight again.  Of course, call if he has new symptoms.

## 2021-04-02 NOTE — Assessment & Plan Note (Signed)
Stablilized with focus on higher caloric intake.  Mom will continue to push high calorie foods.  We can recheck in 2 months.  Sooner as needed if new or worsening symptoms.

## 2021-04-12 ENCOUNTER — Encounter: Payer: Self-pay | Admitting: Family Medicine

## 2021-04-12 ENCOUNTER — Telehealth: Payer: Self-pay

## 2021-04-12 NOTE — Telephone Encounter (Signed)
Patient's mother calls nurse line requesting letter to excuse patient from jury duty due to autism diagnosis . Patient is scheduled for 06/19/21, jury #[id number].   Patient would like letter to be mailed to home address in chart once completed.   Please advise.   Veronda Prude, RN

## 2021-04-12 NOTE — Progress Notes (Signed)
Patient is scheduled for jury duty on 06/19/21. Mom requests excuse as he is autistic. Letter written for patient.   Katha Cabal, DO PGY-2, [facility] 04/12/2021 2:12 PM

## 2021-04-17 NOTE — Telephone Encounter (Signed)
Sent letter via office mail yesterday morning.   Veronda Prude, RN

## 2021-05-17 ENCOUNTER — Other Ambulatory Visit: Payer: Self-pay | Admitting: Family Medicine

## 2021-07-20 ENCOUNTER — Encounter: Payer: Self-pay | Admitting: Family Medicine

## 2021-07-20 ENCOUNTER — Other Ambulatory Visit: Payer: Self-pay

## 2021-07-20 ENCOUNTER — Ambulatory Visit (INDEPENDENT_AMBULATORY_CARE_PROVIDER_SITE_OTHER): Payer: Medicare Other | Admitting: Family Medicine

## 2021-07-20 VITALS — BP 110/78 | HR 63 | Ht 66.0 in | Wt 152.2 lb

## 2021-07-20 DIAGNOSIS — Z0000 Encounter for general adult medical examination without abnormal findings: Secondary | ICD-10-CM

## 2021-07-20 DIAGNOSIS — F980 Enuresis not due to a substance or known physiological condition: Secondary | ICD-10-CM | POA: Diagnosis not present

## 2021-07-20 MED ORDER — OXYBUTYNIN CHLORIDE ER 10 MG PO TB24: 10.0000 mg | ORAL_TABLET | Freq: Every day | ORAL | 0 refills | Status: DC

## 2021-07-20 NOTE — Patient Instructions (Signed)
Thank you for coming in today.  I am glad that you continue to do well and are having fun at the center.  Today we discussed following up with psychiatry.  I also refilled the oxybutynin.  And we also took blood for a cholesterol panel.  I will notify you when this is available.  Follow-up in 1 year or sooner as needed.  Dr. Salvadore Dom

## 2021-07-20 NOTE — Progress Notes (Signed)
    SUBJECTIVE:   Chief compliant/HPI: annual examination  Nicholas Harding is a 35 y.o. autistic non-verbal male who presents today with his mother for an annual exam.   Reviewed and updated history.   Review of systems form without concern.    OBJECTIVE:   BP 110/78   Pulse 63   Ht 5\' 6"  (1.676 m)   Wt 152 lb 3.2 oz (69 kg)   SpO2 97%   BMI 24.57 kg/m    General Appearance:    Alert, cooperative, no distress, appears stated age  Head:    Normocephalic, without obvious abnormality, atraumatic  Eyes:    PERRL, conjunctiva/corneas clear       Nose:   Nares normal, septum midline, mucosa normal, no drainage   or sinus tenderness  Throat:   Lips, mucosa, and tongue normal; teeth and gums normal  Neck:   Supple, symmetrical, trachea midline, no adenopathy;       thyroid:  No enlargement/tenderness/nodules; no carotid   bruit or JVD     Lungs:     Clear to auscultation bilaterally, respirations unlabored     Heart:    Regular rate and rhythm, S1 and S2 normal, no murmur, rub   or gallop  Abdomen:     Soft, non-tender, bowel sounds active all four quadrants,    no masses, no organomegaly        Extremities:   Extremities normal, atraumatic, no cyanosis or edema  Pulses:   2+ and symmetric all extremities  Skin:   Skin color, texture, turgor normal, no rashes or lesions  Lymph nodes:   Cervical and supraclavicular node normal  Neurologic:   Alert. Able to follow commands during exam.     ASSESSMENT/PLAN:   Encounter for routine history and physical exam for male Nicholas Harding is a pleasant 35 yo autistic non-verbal male. He is cared for by his mother. He goes to a center during the day. He sees psych every 3 months, next appt 8/23 @ 9/23. He is doing well. He looks healthy. Prior concern about not being able to gain weight. Mother increased caloric intake and patient has gained 6 lbs since last visit in April.   ENURESIS - oxybutynin (DITROPAN-XL) 10 MG 24 hr  tablet; Take 1 tablet (10 mg total) by mouth at bedtime.  Dispense: 90 tablet; Refill: 0  Annual Examination  See AVS for age appropriate recommendations  PHQ score 0 (filled out by mother so question validity), reviewed..  Blood pressure reviewed and at goal.    Considered the following items based upon USPSTF recommendations: HIV testing: not indicated Hepatitis C: not indicated Hepatitis B: not indicated Syphilis if at high risk: {not indicated GC/CTnot indicated Lipid panel (nonfasting or fasting) discussed based upon AHA recommendations and ordered.  Consider repeat every 4-6 years.  Reviewed risk factors for latent tuberculosis and not indicated Immunizations UTD   Follow up in 1 year or sooner if indicated.    May, DO [facility]

## 2021-07-21 LAB — LIPID PANEL
Chol/HDL Ratio: 2.5 ratio (ref 0.0–5.0)
Cholesterol, Total: 137 mg/dL (ref 100–199)
HDL: 55 mg/dL (ref >39)
LDL Chol Calc (NIH): 72 mg/dL (ref 0–99)
Triglycerides: 44 mg/dL (ref 0–149)
VLDL Cholesterol Cal: 10 mg/dL (ref 5–40)

## 2021-10-11 ENCOUNTER — Other Ambulatory Visit: Payer: Self-pay | Admitting: Family Medicine

## 2021-10-11 DIAGNOSIS — F980 Enuresis not due to a substance or known physiological condition: Secondary | ICD-10-CM

## 2022-03-10 ENCOUNTER — Other Ambulatory Visit: Payer: Self-pay | Admitting: Family Medicine

## 2022-03-10 DIAGNOSIS — F980 Enuresis not due to a substance or known physiological condition: Secondary | ICD-10-CM

## 2022-05-21 ENCOUNTER — Other Ambulatory Visit: Payer: Self-pay

## 2022-05-21 DIAGNOSIS — K5900 Constipation, unspecified: Secondary | ICD-10-CM

## 2022-05-21 MED ORDER — POLYETHYLENE GLYCOL 3350 17 GM/SCOOP PO POWD: ORAL | 11 refills | Status: AC

## 2022-05-28 ENCOUNTER — Encounter: Payer: Self-pay | Admitting: *Deleted

## 2022-07-23 ENCOUNTER — Encounter: Payer: Self-pay | Admitting: Family Medicine

## 2022-07-23 ENCOUNTER — Ambulatory Visit (INDEPENDENT_AMBULATORY_CARE_PROVIDER_SITE_OTHER): Payer: Medicare Other | Admitting: Family Medicine

## 2022-07-23 VITALS — BP 110/70 | HR 67 | Wt 157.2 lb

## 2022-07-23 DIAGNOSIS — R7301 Impaired fasting glucose: Secondary | ICD-10-CM | POA: Diagnosis not present

## 2022-07-23 DIAGNOSIS — Z0000 Encounter for general adult medical examination without abnormal findings: Secondary | ICD-10-CM

## 2022-07-23 DIAGNOSIS — Z13220 Encounter for screening for lipoid disorders: Secondary | ICD-10-CM | POA: Diagnosis not present

## 2022-07-23 DIAGNOSIS — F840 Autistic disorder: Secondary | ICD-10-CM | POA: Diagnosis not present

## 2022-07-23 DIAGNOSIS — F980 Enuresis not due to a substance or known physiological condition: Secondary | ICD-10-CM

## 2022-07-23 DIAGNOSIS — Z79899 Other long term (current) drug therapy: Secondary | ICD-10-CM

## 2022-07-23 DIAGNOSIS — R634 Abnormal weight loss: Secondary | ICD-10-CM

## 2022-07-23 NOTE — Progress Notes (Signed)
    SUBJECTIVE:   Chief compliant/HPI: annual examination  Nicholas Harding is a 36 y.o. who presents today for an annual exam. Patient mother concerned about weight loss and requested refill on oxybutynin to manage patients nighttime incontinence. She also requested yearly lab work. Patient non-verbal and patient unable to provide history.  Reviewed and updated history.  Review of systems form notable for none.   OBJECTIVE:   BP 110/70   Pulse 67   Wt 157 lb 3.2 oz (71.3 kg)   SpO2 99%   BMI 25.37 kg/m   Physical Exam HEENT: Normocephalic. No thyromegaly or goiter. Strabismus bilaterally. Occlusive earwax bilaterally, unable to visualize TM. Patient unable to follow certain commands. Cardio: Regular rate and rhythm with no murmurs, rubs or gallops Pulm: CTAB, normal work of breathing. Abd: Soft, non-tender in all quadrants. No organomegaly. MSK: No peripheral edema.   ASSESSMENT/PLAN:   Unintentional weight loss Weight increased. BMI 25, in overweight range. Discussed with patient mother to reduce Ensure + consumption from twice daily to once daily.  ENURESIS Stable, doing well. Refill med.  Active autistic disorder Patient currently seeing Dr. Theophilus Bones with psychiatry every 3 months. Ordered labs for long-term psychiatric medication management. Med list updated.    Annual Examination  See AVS for age appropriate recommendations  PHQ score not applicable, reviewed and discussed.  Blood pressure reviewed and at goal.  Advanced directive: did not discuss  Considered the following items based upon USPSTF recommendations: HIV testing: not indicated Hepatitis C: not indicated Hepatitis B: not indicated Syphilis if at high risk: {not indicated GC/CTnot indicated Lipid panel (nonfasting or fasting) discussed based upon AHA recommendations and ordered.  Consider repeat every 4-6 years.  Reviewed risk factors for latent tuberculosis and not indicated Immunizations UTD.    Follow up in 1 year or sooner if indicated.    Elberta Fortis, MD [facility]

## 2022-07-23 NOTE — Assessment & Plan Note (Signed)
Weight increased. BMI 25, in overweight range. Discussed with patient mother to reduce Ensure + consumption from twice daily to once daily.

## 2022-07-23 NOTE — Patient Instructions (Signed)
It was wonderful to see you today.  Please bring ALL of your medications with you to every visit.   Today we talked about:  Please reduce Ensure + to one time daily as Hicks is gaining weight appropriately. We ordered blood work to be completed to monitor his medications. Use Debrox twice daily for 2 weeks to clean out both ears. Afterward twice weekly Debrox for maintenance to keep earwax manageable.  Please call the dentist to schedule an appointment.  Please follow up in 1 year.  Thank you for choosing [facility].   Please call [phone] with any questions about today's appointment.  Please be sure to schedule follow up at the front  desk before you leave today.   Elberta Fortis, DO Family Medicine

## 2022-07-23 NOTE — Assessment & Plan Note (Signed)
Patient currently seeing Dr. Theophilus Bones with psychiatry every 3 months. Ordered labs for long-term psychiatric medication management. Med list updated.

## 2022-07-23 NOTE — Assessment & Plan Note (Signed)
Stable, doing well. Refill med.

## 2022-07-24 LAB — COMPREHENSIVE METABOLIC PANEL
ALT: 5 IU/L (ref 0–44)
AST: 21 IU/L (ref 0–40)
Albumin/Globulin Ratio: 1.2 (ref 1.2–2.2)
Albumin: 3.9 g/dL — ABNORMAL LOW (ref 4.1–5.1)
Alkaline Phosphatase: 68 IU/L (ref 44–121)
BUN/Creatinine Ratio: 16 (ref 9–20)
BUN: 15 mg/dL (ref 6–20)
Bilirubin Total: 0.4 mg/dL (ref 0.0–1.2)
CO2: 21 mmol/L (ref 20–29)
Calcium: 9.3 mg/dL (ref 8.7–10.2)
Chloride: 104 mmol/L (ref 96–106)
Creatinine, Ser: 0.94 mg/dL (ref 0.76–1.27)
Globulin, Total: 3.3 g/dL (ref 1.5–4.5)
Glucose: 76 mg/dL (ref 70–99)
Potassium: 4.9 mmol/L (ref 3.5–5.2)
Sodium: 142 mmol/L (ref 134–144)
Total Protein: 7.2 g/dL (ref 6.0–8.5)
eGFR: 107 mL/min/{1.73_m2} (ref >59)

## 2022-07-24 LAB — CBC
Hematocrit: 45.2 % (ref 37.5–51.0)
Hemoglobin: 15.3 g/dL (ref 13.0–17.7)
MCH: 30.8 pg (ref 26.6–33.0)
MCHC: 33.8 g/dL (ref 31.5–35.7)
MCV: 91 fL (ref 79–97)
Platelets: 226 10*3/uL (ref 150–450)
RBC: 4.96 x10E6/uL (ref 4.14–5.80)
RDW: 12.2 % (ref 11.6–15.4)
WBC: 3.9 10*3/uL (ref 3.4–10.8)

## 2022-07-24 LAB — HEMOGLOBIN A1C
Est. average glucose Bld gHb Est-mCnc: 114 mg/dL
Hgb A1c MFr Bld: 5.6 % (ref 4.8–5.6)

## 2022-07-24 LAB — LIPID PANEL
Chol/HDL Ratio: 2.8 ratio (ref 0.0–5.0)
Cholesterol, Total: 139 mg/dL (ref 100–199)
HDL: 49 mg/dL (ref >39)
LDL Chol Calc (NIH): 80 mg/dL (ref 0–99)
Triglycerides: 44 mg/dL (ref 0–149)
VLDL Cholesterol Cal: 10 mg/dL (ref 5–40)

## 2022-08-04 ENCOUNTER — Other Ambulatory Visit: Payer: Self-pay | Admitting: Family Medicine

## 2022-08-04 DIAGNOSIS — F980 Enuresis not due to a substance or known physiological condition: Secondary | ICD-10-CM

## 2022-08-05 NOTE — Telephone Encounter (Signed)
Sent in refill for oxybutynin for patient's nocturnal enuresis as discussed at last appointment.  Elberta Fortis, DO

## 2023-01-22 ENCOUNTER — Telehealth: Payer: Self-pay

## 2023-01-22 NOTE — Telephone Encounter (Signed)
I called both numbers on pt chart to do AWV. I was unable to leave a mg on mothers personal vm [phone]) as it was full.The other number was not a working number [phone]).  Pt mother called office at 10:53 to speak w/appt staff. Relayed for mother to r/s and to assure vm empty next appt time.

## 2023-02-09 NOTE — Patient Instructions (Signed)
Health Maintenance, Male Adopting a healthy lifestyle and getting preventive care are important in promoting health and wellness. Ask your health care provider about: The right schedule for you to have regular tests and exams. Things you can do on your own to prevent diseases and keep yourself healthy. What should I know about diet, weight, and exercise? Eat a healthy diet  Eat a diet that includes plenty of vegetables, fruits, low-fat dairy products, and lean protein. Do not eat a lot of foods that are high in solid fats, added sugars, or sodium. Maintain a healthy weight Body mass index (BMI) is a measurement that can be used to identify possible weight problems. It estimates body fat based on height and weight. Your health care provider can help determine your BMI and help you achieve or maintain a healthy weight. Get regular exercise Get regular exercise. This is one of the most important things you can do for your health. Most adults should: Exercise for at least 150 minutes each week. The exercise should increase your heart rate and make you sweat (moderate-intensity exercise). Do strengthening exercises at least twice a week. This is in addition to the moderate-intensity exercise. Spend less time sitting. Even light physical activity can be beneficial. Watch cholesterol and blood lipids Have your blood tested for lipids and cholesterol at 38 years of age, then have this test every 5 years. You may need to have your cholesterol levels checked more often if: Your lipid or cholesterol levels are high. You are older than 38 years of age. You are at high risk for heart disease. What should I know about cancer screening? Many types of cancers can be detected early and may often be prevented. Depending on your health history and family history, you may need to have cancer screening at various ages. This may include screening for: Colorectal cancer. Prostate cancer. Skin cancer. Lung  cancer. What should I know about heart disease, diabetes, and high blood pressure? Blood pressure and heart disease High blood pressure causes heart disease and increases the risk of stroke. This is more likely to develop in people who have high blood pressure readings or are overweight. Talk with your health care provider about your target blood pressure readings. Have your blood pressure checked: Every 3-5 years if you are 18-39 years of age. Every year if you are 40 years old or older. If you are between the ages of 65 and 75 and are a current or former smoker, ask your health care provider if you should have a one-time screening for abdominal aortic aneurysm (AAA). Diabetes Have regular diabetes screenings. This checks your fasting blood sugar level. Have the screening done: Once every three years after age 45 if you are at a normal weight and have a low risk for diabetes. More often and at a younger age if you are overweight or have a high risk for diabetes. What should I know about preventing infection? Hepatitis B If you have a higher risk for hepatitis B, you should be screened for this virus. Talk with your health care provider to find out if you are at risk for hepatitis B infection. Hepatitis C Blood testing is recommended for: Everyone born from 1945 through 1965. Anyone with known risk factors for hepatitis C. Sexually transmitted infections (STIs) You should be screened each year for STIs, including gonorrhea and chlamydia, if: You are sexually active and are younger than 38 years of age. You are older than 38 years of age and your   health care provider tells you that you are at risk for this type of infection. Your sexual activity has changed since you were last screened, and you are at increased risk for chlamydia or gonorrhea. Ask your health care provider if you are at risk. Ask your health care provider about whether you are at high risk for HIV. Your health care provider  may recommend a prescription medicine to help prevent HIV infection. If you choose to take medicine to prevent HIV, you should first get tested for HIV. You should then be tested every 3 months for as long as you are taking the medicine. Follow these instructions at home: Alcohol use Do not drink alcohol if your health care provider tells you not to drink. If you drink alcohol: Limit how much you have to 0-2 drinks a day. Know how much alcohol is in your drink. In the [place] one drink equals one 12 oz bottle of beer (355 mL), one 5 oz glass of wine (148 mL), or one 1 oz glass of hard liquor (44 mL). Lifestyle Do not use any products that contain nicotine or tobacco. These products include cigarettes, chewing tobacco, and vaping devices, such as e-cigarettes. If you need help quitting, ask your health care provider. Do not use street drugs. Do not share needles. Ask your health care provider for help if you need support or information about quitting drugs. General instructions Schedule regular health, dental, and eye exams. Stay current with your vaccines. Tell your health care provider if: You often feel depressed. You have ever been abused or do not feel safe at home. Summary Adopting a healthy lifestyle and getting preventive care are important in promoting health and wellness. Follow your health care provider's instructions about healthy diet, exercising, and getting tested or screened for diseases. Follow your health care provider's instructions on monitoring your cholesterol and blood pressure. This information is not intended to replace advice given to you by your health care provider. Make sure you discuss any questions you have with your health care provider. Document Revised: 04/30/2021 Document Reviewed: 04/30/2021 Elsevier Patient Education  2023 [employer]  

## 2023-02-09 NOTE — Progress Notes (Unsigned)
I connected with  Nicholas Harding. Mother Nicholas Harding on 02/10/2013 by a audio enabled telemedicine application and verified that I am speaking with the correct person using two identifiers.  Patient Location: Home  Provider Location: Home Office  I discussed the limitations of evaluation and management by telemedicine. The patient expressed understanding and agreed to proceed.  Subjective:   Nicholas Harding is a 37 y.o. male who presents for Medicare Annual/Subsequent preventive examination.  Review of Systems    Per HPI unless specifically indicated below.  Cardiac Risk Factors include: advanced age (>85mn, >>57women);male gender          Objective:       07/23/2022    8:33 AM 07/20/2021    9:54 AM 04/02/2021    8:59 AM  Vitals with BMI  Height  5' 6"$  5' 6"$   Weight 157 lbs 3 oz 152 lbs 3 oz 146 lbs 3 oz  BMI  [id number] Systolic [id number]  Diastolic 70 78   Pulse 67 63     There were no vitals filed for this visit. There is no height or weight on file to calculate BMI.     07/20/2021    9:53 AM 07/18/2020    2:43 PM 07/08/2019    2:15 PM 06/26/2017    2:41 PM 05/09/2016    9:22 AM  Advanced Directives  Does Patient Have a Medical Advance Directive? No No No No No  Would patient like information on creating a medical advance directive? No - Patient declined No - Patient declined No - Patient declined No - Patient declined     Current Medications (verified) Outpatient Encounter Medications as of 02/10/2023  Medication Sig   benztropine (COGENTIN) 0.5 MG tablet  (Patient not taking: Reported on 07/23/2022)   carbamide peroxide (DEBROX) 6.5 % OTIC solution Place 5 drops into both ears 2 (two) times daily.   FLUoxetine (PROZAC) 20 MG capsule    LORazepam (ATIVAN) 1 MG tablet    oxybutynin (DITROPAN-XL) 10 MG 24 hr tablet TAKE 1 TABLET AT BEDTIME   polyethylene glycol powder (GLYCOLAX/MIRALAX) 17 GM/SCOOP powder Give 0.5-1 capful as needed for regular, soft BM daily.    risperiDONE (RISPERDAL) 0.5 MG tablet  (Patient not taking: Reported on 07/23/2022)   tetrahydrozoline-zinc (VISINE-AC) 0.05-0.25 % ophthalmic solution Place 1 drop into both eyes 3 (three) times daily as needed.   trimethoprim-polymyxin b (POLYTRIM) ophthalmic solution Place 1 drop into the right eye every 4 (four) hours.   No facility-administered encounter medications on file as of 02/10/2023.    Allergies (verified) Patient has no known allergies.   History: Past Medical History:  Diagnosis Date   Allergy    Autism    Constipation    Depression    Hypertension    Mental retardation    Nonorganic enuresis    No past surgical history on file. Family History  Problem Relation Age of Onset   Diabetes Mellitus II Mother    Breast cancer Maternal Grandmother    Social History   Socioeconomic History   Marital status: Single    Spouse name: Not on file   Number of children: Not on file   Years of education: Not on file   Highest education level: Not on file  Occupational History   Not on file  Tobacco Use   Smoking status: Never   Smokeless tobacco: Never  Substance and Sexual Activity   Alcohol use: No   Drug use:  No   Sexual activity: Never  Other Topics Concern   Not on file  Social History Narrative   Not on file   Social Determinants of Health   Financial Resource Strain: Not on file  Food Insecurity: Not on file  Transportation Needs: Not on file  Physical Activity: Not on file  Stress: Not on file  Social Connections: Not on file    Tobacco Counseling Counseling given: Not Answered   Clinical Intake:                 Diabetic?No         Activities of Daily Living     No data to display          Patient Care Team: Colletta Maryland, MD as PCP - General (Family Medicine)  Indicate any recent Medical Services you may have received from other than [facility] providers in the past year (date may be approximate).     Assessment:    This is a routine wellness examination for Nicholas Harding.  Hearing/Vision screen Denies any hearing issues. Denies any vision changes. Annual Eye Exam  Dietary issues and exercise activities discussed:     Goals Addressed   None    Depression Screen    07/23/2022    9:04 AM 02/26/2021   10:20 AM 07/08/2019    2:14 PM 05/09/2016    9:22 AM  PHQ 2/9 Scores  PHQ - 2 Score 0 0 0 0  PHQ- 9 Score 0 0      Fall Risk    07/08/2019    2:14 PM  Nicholas Harding in the past year? 0  Number falls in past yr: 0    FALL RISK PREVENTION PERTAINING TO THE HOME:  Any stairs in or around the home? {YES/NO:21197} If so, are there any without handrails? {YES/NO:21197} Home free of loose throw rugs in walkways, pet beds, electrical cords, etc? {YES/NO:21197} Adequate lighting in your home to reduce risk of falls? {YES/NO:21197}  ASSISTIVE DEVICES UTILIZED TO PREVENT FALLS:  Life alert? {YES/NO:21197} Use of a cane, walker or w/c? {YES/NO:21197} Grab bars in the bathroom? {YES/NO:21197} Shower chair or bench in shower? {YES/NO:21197} Elevated toilet seat or a handicapped toilet? {YES/NO:21197}  TIMED UP AND GO:  Was the test performed?Unable to perform, virtual appointment   Cognitive Function:        Immunizations Immunization History  Administered Date(s) Administered   Influenza Split 09/20/2011, 10/14/2012   Influenza Whole 01/18/2009   Influenza,inj,Quad PF,6+ Mos 10/05/2013, 09/05/2014, 09/02/2017, 08/28/2018, 10/01/2022   Influenza-Unspecified 09/04/2020   Pneumococcal Conjugate-13 09/02/2017   Pneumococcal Polysaccharide-23 03/22/2020   Td 01/23/2007   Tdap 07/01/2018    TDAP status: Up to date  Flu Vaccine status: Up to date    Covid-19 vaccine status: Information provided on how to obtain vaccines.   Qualifies for Shingles Vaccine? No   Screening Tests Health Maintenance  Topic Date Due   COVID-19 Vaccine (1) Never done   Medicare Annual Wellness (AWV)   07/07/2020   DTaP/Tdap/Td (3 - Td or Tdap) 07/01/2028   INFLUENZA VACCINE  Completed   Hepatitis C Screening  Completed   HIV Screening  Completed   HPV VACCINES  Aged Out    Health Maintenance  Health Maintenance Due  Topic Date Due   COVID-19 Vaccine (1) Never done   Medicare Annual Wellness (AWV)  07/07/2020    Colorectal Cancer Screening: not applicable   Lung Cancer Screening: (Low Dose CT Chest recommended  if Age 45-80 years, 30 pack-year currently smoking OR have quit w/in 15years.) does not qualify.   Lung Cancer Screening Referral: not applicable   Additional Screening:  Hepatitis C Screening: does qualify; Completed 07/18/2020  Vision Screening: Recommended annual ophthalmology exams for early detection of glaucoma and other disorders of the eye. Is the patient up to date with their annual eye exam?  {YES/NO:21197} Who is the provider or what is the name of the office in which the patient attends annual eye exams? *** If pt is not established with a provider, would they like to be referred to a provider to establish care? {YES/NO:21197}.   Dental Screening: Recommended annual dental exams for proper oral hygiene  Community Resource Referral / Chronic Care Management: CRR required this visit?  No   CCM required this visit?  No      Plan:     I have personally reviewed and noted the following in the patient's chart:   Medical and social history Use of alcohol, tobacco or illicit drugs  Current medications and supplements including opioid prescriptions. Patient is not currently taking opioid prescriptions. Functional ability and status Nutritional status Physical activity Advanced directives List of other physicians Hospitalizations, surgeries, and ER visits in previous 12 months Vitals Screenings to include cognitive, depression, and falls Referrals and appointments  In addition, I have reviewed and discussed with patient certain preventive protocols,  quality metrics, and best practice recommendations. A written personalized care plan for preventive services as well as general preventive health recommendations were provided to patient.    Mr. Nicholas Harding , Thank you for taking time to come for your Medicare Wellness Visit. I appreciate your ongoing commitment to your health goals. Please review the following plan we discussed and let me know if I can assist you in the future.   These are the goals we discussed:  Goals   None     This is a list of the screening recommended for you and due dates:  Health Maintenance  Topic Date Due   COVID-19 Vaccine (1) Never done   Medicare Annual Wellness Visit  02/11/2024   DTaP/Tdap/Td vaccine (3 - Td or Tdap) 07/01/2028   Flu Shot  Completed   Hepatitis C Screening: USPSTF Recommendation to screen - Ages 18-79 yo.  Completed   HIV Screening  Completed   HPV Vaccine  Aged [address] [place]   02/10/2023  Nurse Notes: Approximately 30 minute Non-Face -To-Face Medicare Wellness Visit

## 2023-02-10 ENCOUNTER — Ambulatory Visit (INDEPENDENT_AMBULATORY_CARE_PROVIDER_SITE_OTHER): Payer: Medicare Other

## 2023-02-10 DIAGNOSIS — Z0000 Encounter for general adult medical examination without abnormal findings: Secondary | ICD-10-CM | POA: Diagnosis not present

## 2023-05-25 ENCOUNTER — Other Ambulatory Visit: Payer: Self-pay | Admitting: Family Medicine

## 2023-05-25 DIAGNOSIS — F980 Enuresis not due to a substance or known physiological condition: Secondary | ICD-10-CM

## 2024-03-06 ENCOUNTER — Other Ambulatory Visit: Payer: Self-pay | Admitting: Family Medicine

## 2024-03-06 DIAGNOSIS — F980 Enuresis not due to a substance or known physiological condition: Secondary | ICD-10-CM

## 2024-05-20 ENCOUNTER — Other Ambulatory Visit: Payer: Self-pay | Admitting: Family Medicine

## 2024-05-20 DIAGNOSIS — F980 Enuresis not due to a substance or known physiological condition: Secondary | ICD-10-CM
